# Patient Record
Sex: Female | Born: 1985
Health system: Southern US, Community
[De-identification: ages and names within clinical notes are randomized; demographics above are authoritative.]

## PROBLEM LIST (undated history)

## (undated) DIAGNOSIS — Z789 Other specified health status: Secondary | ICD-10-CM

## (undated) DIAGNOSIS — D649 Anemia, unspecified: Secondary | ICD-10-CM

## (undated) DIAGNOSIS — T8859XA Other complications of anesthesia, initial encounter: Secondary | ICD-10-CM

## (undated) DIAGNOSIS — K0889 Other specified disorders of teeth and supporting structures: Secondary | ICD-10-CM

## (undated) DIAGNOSIS — IMO0002 Reserved for concepts with insufficient information to code with codable children: Secondary | ICD-10-CM

## (undated) DIAGNOSIS — R519 Headache, unspecified: Secondary | ICD-10-CM

## (undated) DIAGNOSIS — I10 Essential (primary) hypertension: Secondary | ICD-10-CM

## (undated) DIAGNOSIS — Z8619 Personal history of other infectious and parasitic diseases: Secondary | ICD-10-CM

## (undated) DIAGNOSIS — R1310 Dysphagia, unspecified: Secondary | ICD-10-CM

## (undated) DIAGNOSIS — R0602 Shortness of breath: Secondary | ICD-10-CM

## (undated) DIAGNOSIS — M549 Dorsalgia, unspecified: Secondary | ICD-10-CM

## (undated) DIAGNOSIS — R51 Headache: Secondary | ICD-10-CM

## (undated) DIAGNOSIS — R002 Palpitations: Secondary | ICD-10-CM

## (undated) DIAGNOSIS — M7989 Other specified soft tissue disorders: Secondary | ICD-10-CM

## (undated) DIAGNOSIS — T4145XA Adverse effect of unspecified anesthetic, initial encounter: Secondary | ICD-10-CM

## (undated) HISTORY — DX: Anemia, unspecified: D64.9

## (undated) HISTORY — DX: Reserved for concepts with insufficient information to code with codable children: IMO0002

## (undated) HISTORY — DX: Palpitations: R00.2

## (undated) HISTORY — DX: Essential (primary) hypertension: I10

## (undated) HISTORY — DX: Headache, unspecified: R51.9

## (undated) HISTORY — DX: Headache: R51

## (undated) HISTORY — PX: OTHER SURGICAL HISTORY: SHX169

## (undated) HISTORY — DX: Shortness of breath: R06.02

## (undated) HISTORY — DX: Dysphagia, unspecified: R13.10

## (undated) HISTORY — DX: Other specified soft tissue disorders: M79.89

## (undated) HISTORY — PX: EYE SURGERY: SHX253

## (undated) HISTORY — PX: VAGINAL URETHRAL SEPTUM EXCISION: SHX2645

## (undated) HISTORY — DX: Personal history of other infectious and parasitic diseases: Z86.19

## (undated) HISTORY — DX: Dorsalgia, unspecified: M54.9

## (undated) HISTORY — DX: Other specified disorders of teeth and supporting structures: K08.89

---

## 2008-05-08 ENCOUNTER — Other Ambulatory Visit: Admission: RE | Admit: 2008-05-08 | Discharge: 2008-05-08 | Payer: Self-pay | Admitting: Obstetrics and Gynecology

## 2008-10-01 ENCOUNTER — Emergency Department (HOSPITAL_COMMUNITY): Admission: EM | Admit: 2008-10-01 | Discharge: 2008-10-01 | Payer: Self-pay | Admitting: Emergency Medicine

## 2010-04-16 ENCOUNTER — Other Ambulatory Visit: Admission: RE | Admit: 2010-04-16 | Discharge: 2010-04-16 | Payer: Self-pay | Admitting: Obstetrics and Gynecology

## 2010-12-28 ENCOUNTER — Inpatient Hospital Stay (HOSPITAL_COMMUNITY)
Admission: AD | Admit: 2010-12-28 | Discharge: 2010-12-28 | Payer: Self-pay | Source: Home / Self Care | Attending: Obstetrics and Gynecology | Admitting: Obstetrics and Gynecology

## 2010-12-31 ENCOUNTER — Ambulatory Visit (HOSPITAL_COMMUNITY): Admission: RE | Admit: 2010-12-31 | Payer: Self-pay | Source: Home / Self Care | Admitting: Obstetrics and Gynecology

## 2011-01-05 LAB — CBC
HCT: 39.2 % (ref 36.0–46.0)
Hemoglobin: 13.3 g/dL (ref 12.0–15.0)
MCH: 29.6 pg (ref 26.0–34.0)
MCHC: 33.9 g/dL (ref 30.0–36.0)
MCV: 87.3 fL (ref 78.0–100.0)
Platelets: 278 10*3/uL (ref 150–400)
RBC: 4.49 MIL/uL (ref 3.87–5.11)
RDW: 13.1 % (ref 11.5–15.5)
WBC: 7.6 10*3/uL (ref 4.0–10.5)

## 2011-01-05 LAB — ABO/RH: ABO/RH(D): A POS

## 2011-09-01 ENCOUNTER — Other Ambulatory Visit: Payer: Self-pay | Admitting: Obstetrics and Gynecology

## 2011-09-22 LAB — POCT URINALYSIS DIP (DEVICE)
Bilirubin Urine: NEGATIVE
Glucose, UA: NEGATIVE
Ketones, ur: NEGATIVE
Nitrite: NEGATIVE
Operator id: 235561
Protein, ur: >=300 — AB
Specific Gravity, Urine: >=1.03
Urobilinogen, UA: 0.2
pH: 7.5

## 2011-09-22 LAB — URINE CULTURE: Colony Count: >=100000

## 2011-09-22 LAB — POCT PREGNANCY, URINE: Preg Test, Ur: NEGATIVE

## 2011-12-22 NOTE — L&D Delivery Note (Signed)
Delivery Note At 8:50 AM a viable and healthy female was delivered via Vaginal, Spontaneous Delivery (Presentation: ;  ).  APGAR: 7, 9; weight 7 lb 9.4 oz (3442 g).   Placenta status: Intact, Spontaneous.  Cord: 3 vessels. Loose nuchal cord.  Anesthesia: Epidural  Episiotomy: Median Lacerations: None Suture Repair: 3.0 chromic Est. Blood Loss (mL): 400  Mom to postpartum.  Baby to nursery-stable.  Malek Skog D 03/29/2012, 9:35 AM

## 2012-03-28 ENCOUNTER — Inpatient Hospital Stay (HOSPITAL_COMMUNITY)
Admission: AD | Admit: 2012-03-28 | Discharge: 2012-03-28 | Disposition: A | Discharge status: Home / Self Care | Payer: 59 | Source: Ambulatory Visit | Attending: Obstetrics and Gynecology | Admitting: Obstetrics and Gynecology

## 2012-03-28 ENCOUNTER — Encounter (HOSPITAL_COMMUNITY): Payer: Self-pay | Admitting: *Deleted

## 2012-03-28 DIAGNOSIS — O479 False labor, unspecified: Secondary | ICD-10-CM | POA: Insufficient documentation

## 2012-03-28 HISTORY — DX: Adverse effect of unspecified anesthetic, initial encounter: T41.45XA

## 2012-03-28 HISTORY — DX: Other complications of anesthesia, initial encounter: T88.59XA

## 2012-03-28 NOTE — MAU Note (Signed)
Contractions since 4pm every 2 minutes. No vaginal bleeding and good fetal movement

## 2012-03-28 NOTE — Discharge Instructions (Signed)
Normal Labor and Delivery Your caregiver must first be sure you are in labor. Signs of labor include:  You may pass what is called "the mucus plug" before labor begins. This is a small amount of blood stained mucus.   Regular uterine contractions.   The time between contractions get closer together.   The discomfort and pain gradually gets more intense.   Pains are mostly located in the back.   Pains get worse when walking.   The cervix (the opening of the uterus becomes thinner (begins to efface) and opens up (dilates).  Once you are in labor and admitted into the hospital or care center, your caregiver will do the following:  A complete physical examination.   Check your vital signs (blood pressure, pulse, temperature and the fetal heart rate).   Do a vaginal examination (using a sterile glove and lubricant) to determine:   The position (presentation) of the baby (head [vertex] or buttock first).   The level (station) of the baby's head in the birth canal.   The effacement and dilatation of the cervix.   You may have your pubic hair shaved and be given an enema depending on your caregiver and the circumstance.   An electronic monitor is usually placed on your abdomen. The monitor follows the length and intensity of the contractions, as well as the baby's heart rate.   Usually, your caregiver will insert an IV in your arm with a bottle of sugar water. This is done as a precaution so that medications can be given to you quickly during labor or delivery.  NORMAL LABOR AND DELIVERY IS DIVIDED UP INTO 3 STAGES: First Stage This is when regular contractions begin and the cervix begins to efface and dilate. This stage can last from 3 to 15 hours. The end of the first stage is when the cervix is 100% effaced and 10 centimeters dilated. Pain medications may be given by   Injection (morphine, demerol, etc.)   Regional anesthesia (spinal, caudal or epidural, anesthetics given in  different locations of the spine). Paracervical pain medication may be given, which is an injection of and anesthetic on each side of the cervix.  A pregnant woman may request to have "Natural Childbirth" which is not to have any medications or anesthesia during her labor and delivery. Second Stage This is when the baby comes down through the birth canal (vagina) and is born. This can take 1 to 4 hours. As the baby's head comes down through the birth canal, you may feel like you are going to have a bowel movement. You will get the urge to bear down and push until the baby is delivered. As the baby's head is being delivered, the caregiver will decide if an episiotomy (a cut in the perineum and vagina area) is needed to prevent tearing of the tissue in this area. The episiotomy is sewn up after the delivery of the baby and placenta. Sometimes a mask with nitrous oxide is given for the mother to breath during the delivery of the baby to help if there is too much pain. The end of Stage 2 is when the baby is fully delivered. Then when the umbilical cord stops pulsating it is clamped and cut. Third Stage The third stage begins after the baby is completely delivered and ends after the placenta (afterbirth) is delivered. This usually takes 5 to 30 minutes. After the placenta is delivered, a medication is given either by intravenous or injection to help contract   the uterus and prevent bleeding. The third stage is not painful and pain medication is usually not necessary. If an episiotomy was done, it is repaired at this time. After the delivery, the mother is watched and monitored closely for 1 to 2 hours to make sure there is no postpartum bleeding (hemorrhage). If there is a lot of bleeding, medication is given to contract the uterus and stop the bleeding. Document Released: 09/15/2008 Document Revised: 11/26/2011 Document Reviewed: 09/15/2008 Exit[employer] Patient Information 2012 [employer]. 

## 2012-03-29 ENCOUNTER — Encounter (HOSPITAL_COMMUNITY): Payer: Self-pay | Admitting: *Deleted

## 2012-03-29 ENCOUNTER — Encounter (HOSPITAL_COMMUNITY): Payer: Self-pay | Admitting: Anesthesiology

## 2012-03-29 ENCOUNTER — Inpatient Hospital Stay (HOSPITAL_COMMUNITY)
Admission: AD | Admit: 2012-03-29 | Discharge: 2012-03-31 | DRG: 775 | Disposition: A | Discharge status: Home / Self Care | Payer: 59 | Attending: Obstetrics and Gynecology | Admitting: Obstetrics and Gynecology

## 2012-03-29 ENCOUNTER — Inpatient Hospital Stay (HOSPITAL_COMMUNITY): Payer: 59 | Admitting: Anesthesiology

## 2012-03-29 DIAGNOSIS — Z3480 Encounter for supervision of other normal pregnancy, unspecified trimester: Secondary | ICD-10-CM

## 2012-03-29 HISTORY — DX: Other specified health status: Z78.9

## 2012-03-29 LAB — CBC
Hemoglobin: 13.8 g/dL (ref 12.0–15.0)
MCV: 90 fL (ref 78.0–100.0)
Platelets: 230 10*3/uL (ref 150–400)
RBC: 4.51 MIL/uL (ref 3.87–5.11)
WBC: 14.9 10*3/uL — ABNORMAL HIGH (ref 4.0–10.5)

## 2012-03-29 LAB — RPR: RPR Ser Ql: NONREACTIVE

## 2012-03-29 MED ORDER — ONDANSETRON HCL 4 MG/2ML IJ SOLN: 4.0000 mg | Freq: Four times a day (QID) | INTRAMUSCULAR | Status: DC | PRN

## 2012-03-29 MED ORDER — LACTATED RINGERS IV SOLN
INTRAVENOUS | Status: DC
  Administered 2012-03-29 (×2): via INTRAVENOUS

## 2012-03-29 MED ORDER — LANOLIN HYDROUS EX OINT: TOPICAL_OINTMENT | CUTANEOUS | Status: DC | PRN

## 2012-03-29 MED ORDER — BENZOCAINE-MENTHOL 20-0.5 % EX AERO
1.0000 "application " | INHALATION_SPRAY | CUTANEOUS | Status: DC | PRN
  Administered 2012-03-29: 1 via TOPICAL

## 2012-03-29 MED ORDER — OXYTOCIN BOLUS FROM INFUSION
500.0000 mL | Freq: Once | INTRAVENOUS | Status: DC
  Filled 2012-03-29: qty 1000
  Filled 2012-03-29: qty 500

## 2012-03-29 MED ORDER — TETANUS-DIPHTH-ACELL PERTUSSIS 5-2.5-18.5 LF-MCG/0.5 IM SUSP: 0.5000 mL | Freq: Once | INTRAMUSCULAR | Status: DC

## 2012-03-29 MED ORDER — FENTANYL 2.5 MCG/ML BUPIVACAINE 1/10 % EPIDURAL INFUSION (WH - ANES)
14.0000 mL/h | INTRAMUSCULAR | Status: DC
  Administered 2012-03-29: 14 mL/h via EPIDURAL
  Filled 2012-03-29: qty 60

## 2012-03-29 MED ORDER — OXYCODONE-ACETAMINOPHEN 5-325 MG PO TABS: 1.0000 | ORAL_TABLET | ORAL | Status: DC | PRN

## 2012-03-29 MED ORDER — LACTATED RINGERS IV SOLN: 500.0000 mL | INTRAVENOUS | Status: DC | PRN

## 2012-03-29 MED ORDER — SIMETHICONE 80 MG PO CHEW: 80.0000 mg | CHEWABLE_TABLET | ORAL | Status: DC | PRN

## 2012-03-29 MED ORDER — BENZOCAINE-MENTHOL 20-0.5 % EX AERO
INHALATION_SPRAY | CUTANEOUS | Status: AC
  Administered 2012-03-29: 1 via TOPICAL
  Filled 2012-03-29: qty 56

## 2012-03-29 MED ORDER — PHENYLEPHRINE 40 MCG/ML (10ML) SYRINGE FOR IV PUSH (FOR BLOOD PRESSURE SUPPORT): 80.0000 ug | PREFILLED_SYRINGE | INTRAVENOUS | Status: DC | PRN

## 2012-03-29 MED ORDER — DIPHENHYDRAMINE HCL 50 MG/ML IJ SOLN: 12.5000 mg | INTRAMUSCULAR | Status: DC | PRN

## 2012-03-29 MED ORDER — LIDOCAINE HCL (PF) 1 % IJ SOLN
INTRAMUSCULAR | Status: DC | PRN
  Administered 2012-03-29: 4 mL
  Administered 2012-03-29: 30 mL
  Administered 2012-03-29 (×2): 4 mL

## 2012-03-29 MED ORDER — LACTATED RINGERS IV SOLN: 500.0000 mL | Freq: Once | INTRAVENOUS | Status: DC

## 2012-03-29 MED ORDER — LIDOCAINE HCL (PF) 1 % IJ SOLN
30.0000 mL | INTRAMUSCULAR | Status: DC | PRN
  Filled 2012-03-29: qty 30

## 2012-03-29 MED ORDER — ONDANSETRON HCL 4 MG PO TABS: 4.0000 mg | ORAL_TABLET | ORAL | Status: DC | PRN

## 2012-03-29 MED ORDER — EPHEDRINE 5 MG/ML INJ
10.0000 mg | INTRAVENOUS | Status: DC | PRN
  Filled 2012-03-29: qty 4

## 2012-03-29 MED ORDER — FLEET ENEMA 7-19 GM/118ML RE ENEM: 1.0000 | ENEMA | RECTAL | Status: DC | PRN

## 2012-03-29 MED ORDER — WITCH HAZEL-GLYCERIN EX PADS: 1.0000 "application " | MEDICATED_PAD | CUTANEOUS | Status: DC | PRN

## 2012-03-29 MED ORDER — ONDANSETRON HCL 4 MG/2ML IJ SOLN: 4.0000 mg | INTRAMUSCULAR | Status: DC | PRN

## 2012-03-29 MED ORDER — CITRIC ACID-SODIUM CITRATE 334-500 MG/5ML PO SOLN
30.0000 mL | ORAL | Status: DC | PRN
  Filled 2012-03-29: qty 15

## 2012-03-29 MED ORDER — OXYTOCIN 20 UNITS IN LACTATED RINGERS INFUSION - SIMPLE
125.0000 mL/h | Freq: Once | INTRAVENOUS | Status: AC
  Administered 2012-03-29: 999 mL/h via INTRAVENOUS

## 2012-03-29 MED ORDER — SENNOSIDES-DOCUSATE SODIUM 8.6-50 MG PO TABS
2.0000 | ORAL_TABLET | Freq: Every day | ORAL | Status: DC
  Administered 2012-03-29: 2 via ORAL

## 2012-03-29 MED ORDER — EPHEDRINE 5 MG/ML INJ: 10.0000 mg | INTRAVENOUS | Status: DC | PRN

## 2012-03-29 MED ORDER — PRENATAL MULTIVITAMIN CH
1.0000 | ORAL_TABLET | Freq: Every day | ORAL | Status: DC
  Administered 2012-03-29 – 2012-03-31 (×3): 1 via ORAL
  Filled 2012-03-29 (×3): qty 1

## 2012-03-29 MED ORDER — TERBUTALINE SULFATE 1 MG/ML IJ SOLN
INTRAMUSCULAR | Status: AC
  Administered 2012-03-29: 0.25 mg
  Filled 2012-03-29: qty 1

## 2012-03-29 MED ORDER — ACETAMINOPHEN 325 MG PO TABS: 650.0000 mg | ORAL_TABLET | ORAL | Status: DC | PRN

## 2012-03-29 MED ORDER — ZOLPIDEM TARTRATE 5 MG PO TABS: 5.0000 mg | ORAL_TABLET | Freq: Every evening | ORAL | Status: DC | PRN

## 2012-03-29 MED ORDER — DIBUCAINE 1 % RE OINT: 1.0000 "application " | TOPICAL_OINTMENT | RECTAL | Status: DC | PRN

## 2012-03-29 MED ORDER — DIPHENHYDRAMINE HCL 25 MG PO CAPS: 25.0000 mg | ORAL_CAPSULE | Freq: Four times a day (QID) | ORAL | Status: DC | PRN

## 2012-03-29 MED ORDER — PHENYLEPHRINE 40 MCG/ML (10ML) SYRINGE FOR IV PUSH (FOR BLOOD PRESSURE SUPPORT)
80.0000 ug | PREFILLED_SYRINGE | INTRAVENOUS | Status: DC | PRN
  Filled 2012-03-29: qty 5

## 2012-03-29 MED ORDER — IBUPROFEN 600 MG PO TABS: 600.0000 mg | ORAL_TABLET | Freq: Four times a day (QID) | ORAL | Status: DC | PRN

## 2012-03-29 MED ORDER — IBUPROFEN 600 MG PO TABS
600.0000 mg | ORAL_TABLET | Freq: Four times a day (QID) | ORAL | Status: DC
  Administered 2012-03-29 – 2012-03-31 (×8): 600 mg via ORAL
  Filled 2012-03-29 (×8): qty 1

## 2012-03-29 NOTE — Anesthesia Procedure Notes (Signed)
Epidural Patient location during procedure: OB Start time: 03/29/2012 7:21 AM Reason for block: procedure for pain  Staffing Anesthesiologist: Raegan Winders Performed by: anesthesiologist   Preanesthetic Checklist Completed: patient identified, site marked, surgical consent, pre-op evaluation, timeout performed, IV checked, risks and benefits discussed and monitors and equipment checked  Epidural Patient position: sitting Prep: site prepped and draped and DuraPrep Patient monitoring: continuous pulse ox and blood pressure Approach: midline Injection technique: LOR air  Needle:  Needle type: Tuohy  Needle gauge: 17 G Needle length: 9 cm Catheter type: closed end flexible Catheter size: 19 Gauge Test dose: negative  Assessment Events: blood not aspirated, injection not painful, no injection resistance, negative IV test and no paresthesia  Additional Notes Discussed risk of headache, infection, bleeding, nerve injury and failed or incomplete block.  Patient voices understanding and wishes to proceed.

## 2012-03-29 NOTE — Anesthesia Preprocedure Evaluation (Signed)
Anesthesia Evaluation  Patient identified by MRN, date of birth, ID band Patient awake    Reviewed: Allergy & Precautions, H&P , NPO status , Patient's Chart, lab work & pertinent test results, reviewed documented beta blocker date and time   Airway Mallampati: III TM Distance: >3 FB Neck ROM: full    Dental  (+) Teeth Intact   Pulmonary neg pulmonary ROS,  breath sounds clear to auscultation        Cardiovascular negative cardio ROS  Rhythm:regular Rate:Normal     Neuro/Psych negative neurological ROS  negative psych ROS   GI/Hepatic negative GI ROS, Neg liver ROS,   Endo/Other  Morbid obesity  Renal/GU negative Renal ROS     Musculoskeletal   Abdominal   Peds  Hematology negative hematology ROS (+)   Anesthesia Other Findings   Reproductive/Obstetrics (+) Pregnancy                           Anesthesia Physical Anesthesia Plan  ASA: II  Anesthesia Plan: Epidural   Post-op Pain Management:    Induction:   Airway Management Planned:   Additional Equipment:   Intra-op Plan:   Post-operative Plan:   Informed Consent: I have reviewed the patients History and Physical, chart, labs and discussed the procedure including the risks, benefits and alternatives for the proposed anesthesia with the patient or authorized representative who has indicated his/her understanding and acceptance.     Plan Discussed with:   Anesthesia Plan Comments:         Anesthesia Quick Evaluation

## 2012-03-29 NOTE — Anesthesia Postprocedure Evaluation (Signed)
  Anesthesia Post Note  Patient: Glenda Crosby  Procedure(s) Performed: * No procedures listed *  Anesthesia type: Epidural  Patient location: Mother/Baby  Post pain: Pain level controlled  Post assessment: Post-op Vital signs reviewed  Last Vitals:  Filed Vitals:   03/29/12 1630  BP: 133/73  Pulse: 89  Temp: 37.1 C  Resp: 20    Post vital signs: Reviewed  Level of consciousness:alert  Complications: No apparent anesthesia complications

## 2012-03-29 NOTE — MAU Note (Signed)
Pt presents with contractions and pressure, has not felt baby move x 2 hours

## 2012-03-30 LAB — CBC
HCT: 34.8 % — ABNORMAL LOW (ref 36.0–46.0)
Hemoglobin: 11.3 g/dL — ABNORMAL LOW (ref 12.0–15.0)
WBC: 12.2 10*3/uL — ABNORMAL HIGH (ref 4.0–10.5)

## 2012-03-30 NOTE — Progress Notes (Signed)
PPD#1 Pt has no complaints. Lochia - wnl VSSAF Imp/ doing well Plan/ routine care.

## 2012-03-31 NOTE — Discharge Summary (Signed)
Discharge diagnoses-  #1-39 week and 6 day intrauterine pregnancy delivered 7 pound 9.4 ounce female infant Apgars 7 and 9  #2-blood type A-positive  Procedures-normal spontaneous delivery-4/9-Dr. Arlyce Dice  Summary-this 26 year old gravida 2 now para 1 was admitted in labor at term after an uneventful pregnancy and had a normal spontaneous delivery of a 7 pound 9.4 ounce female infant with Apgars of 7 and 9. Her postpartum course was benign. On the morning of 4/11 she was ambulating well tolerating a regular diet well breast-feeding without difficulty and was desirous of discharge. Accordingly she was given all appropriate instructions per discharge brochure and understood all instructions well. Discharge medications include vitamins-1 and as long as she is breast-feeding and she will use Tylenol and or Advil as needed for discomfort.   A CBC on 4/10 was 11.3/12.2 platelet count 191,000.  Follow up in the office will be in 4 weeks time or as needed.

## 2012-04-17 NOTE — H&P (Addendum)
This patient was admitted prior to my taking over her care on 03/29/2012.  This note is being filed out of sequence.  Date of admission:  03/29/2012:  26 y.o. G2P0010  Estimated Date of Delivery: 03/30/12 admitted at 39/[redacted] weeks gestation in labor. Prenatal course was uncomplicated. Prenatal labs: Blood Type:A+.  Screening tests for HIV, Syphilis, Hepatitis B, Rubella sensitivity, fetal anomalies, gestational diabetes, and perineal group B strep colonization were negative.    Afebrile, VSS Heart and Lungs: No active disease Abdomen: soft, gravid, EFW AGA. Cervical exam:  Dilated.  Impression: Labor  Plan:  TOL

## 2012-11-25 IMAGING — US US OB COMP LESS 14 WK
1 series · 14 of 28 positions shown · non-contrast
Comparison: None

CLINICAL DATA: Bleeding; ;

OBSTETRIC <14 WK US AND TRANSVAGINAL OB US
TECHNIQUE: Both transabdominal and transvaginal ultrasound
examinations were performed for complete evaluation of the
gestation as well as the maternal uterus, adnexal regions, and
pelvic cul-de-sac.

[Series 1: us ob comp less 14 wks · 14 of 36 slices shown]
[im 2/36]
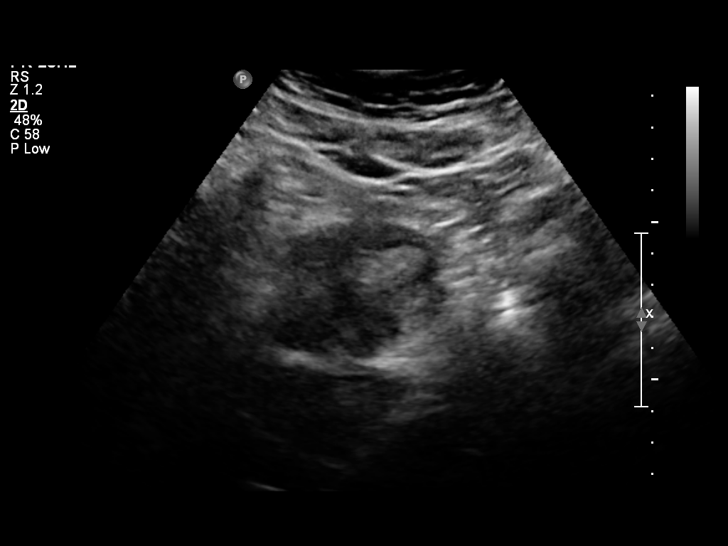
[im 4/36]
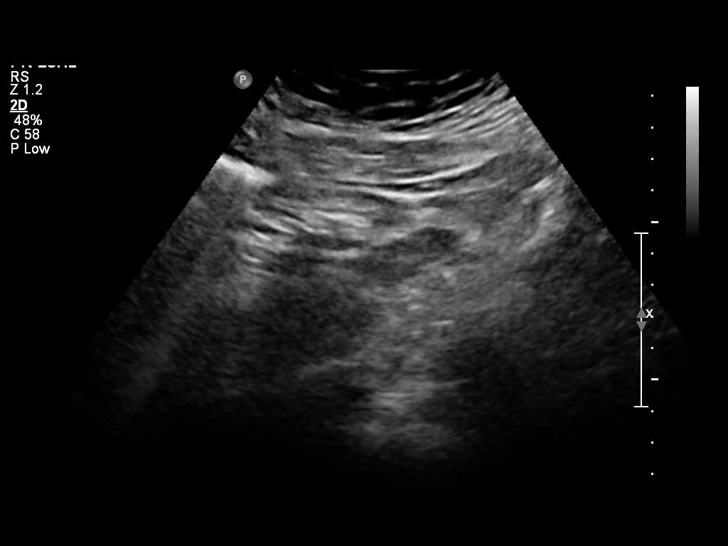
[im 7/36]
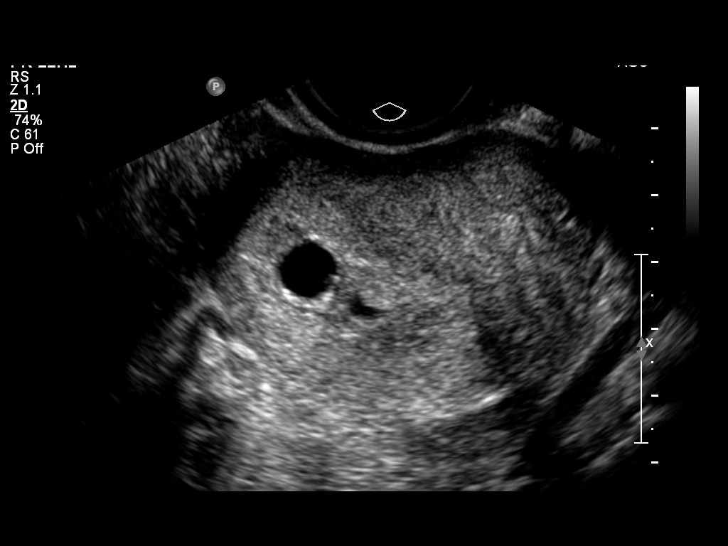
[im 10/36]
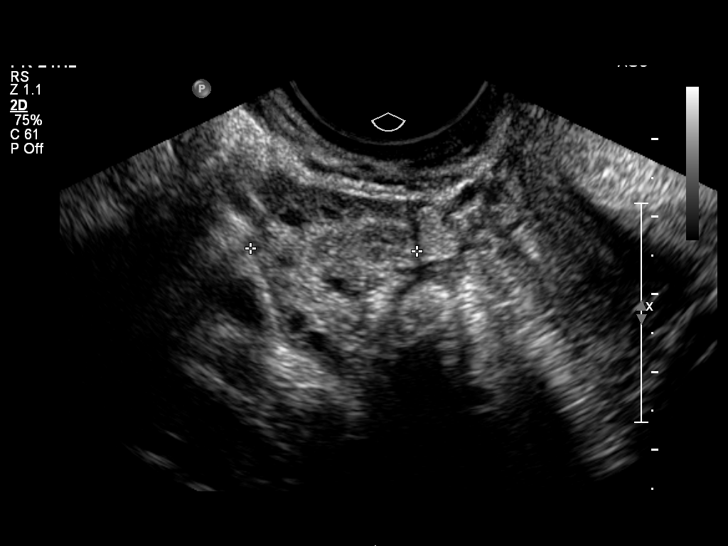
[im 12/36]
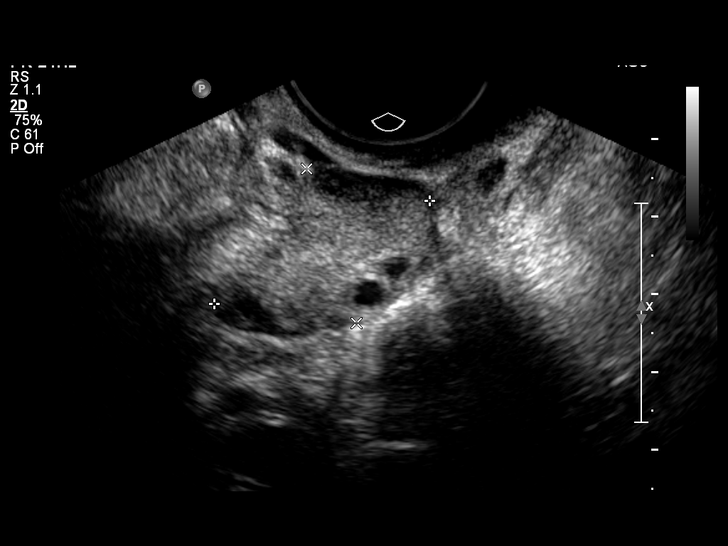
[im 15/36]
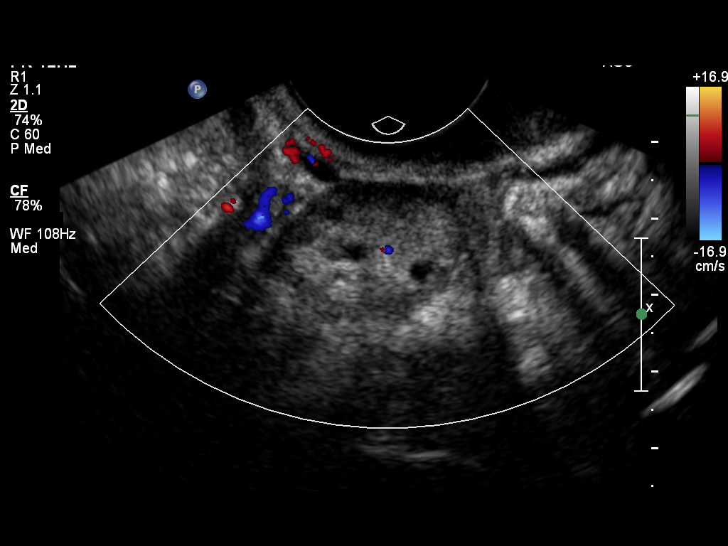
[im 17/36]
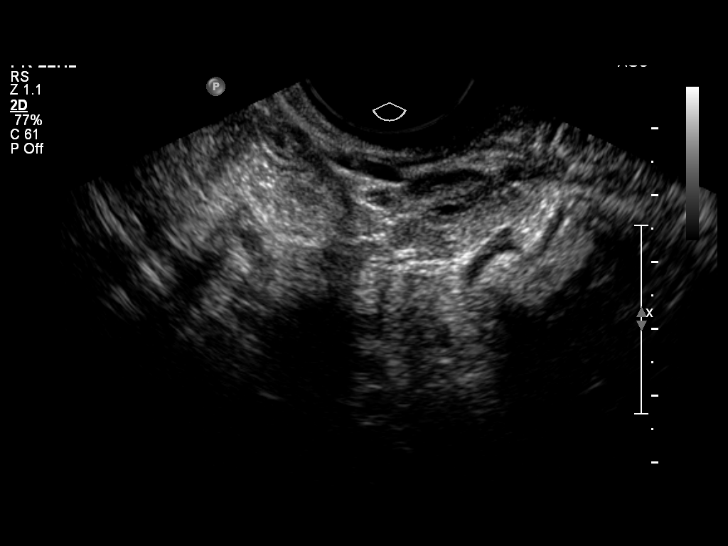
[im 20/36]
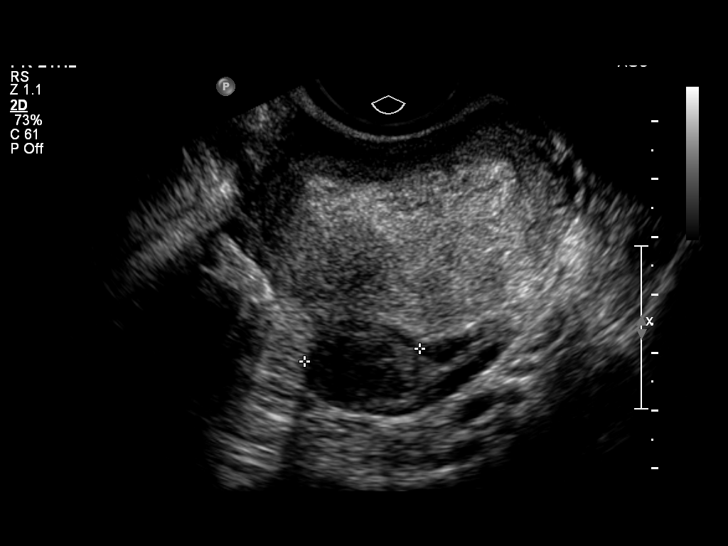
[im 23/36]
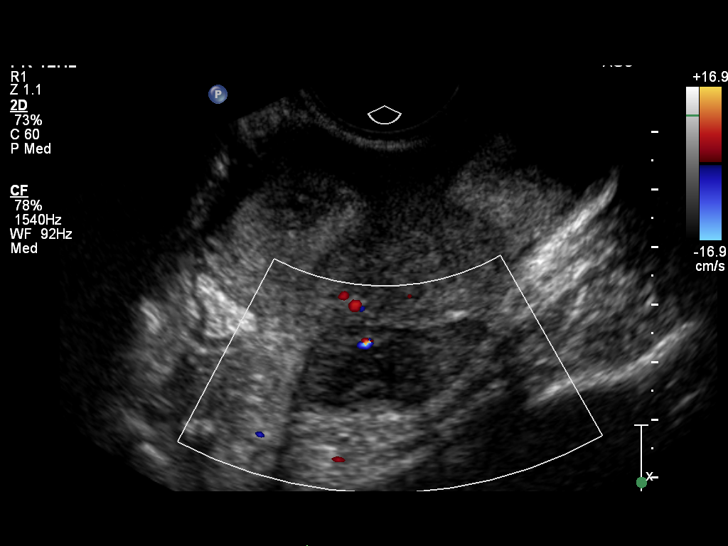
[im 25/36]
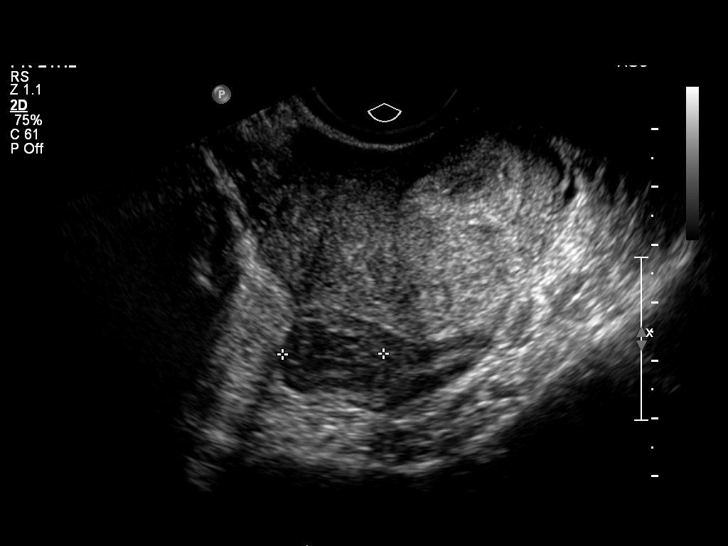
[im 28/36]
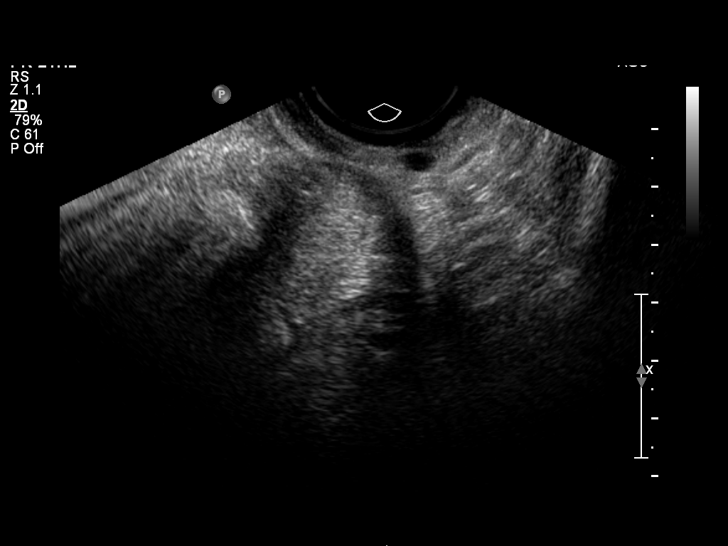
[im 30/36]
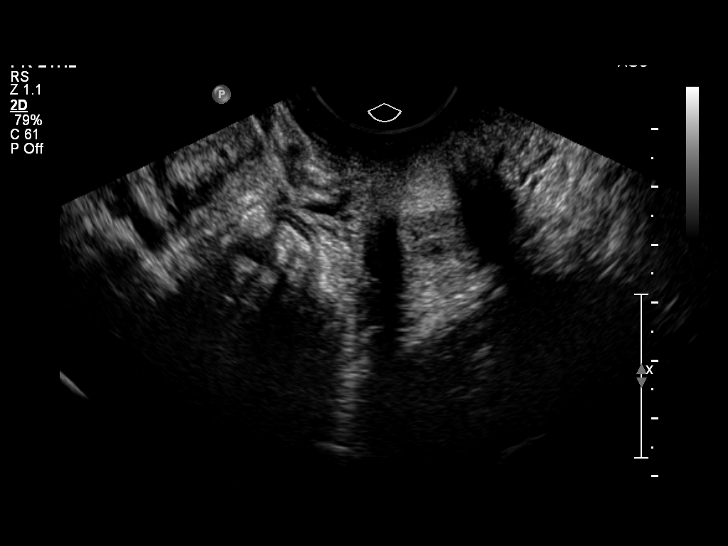
[im 33/36]
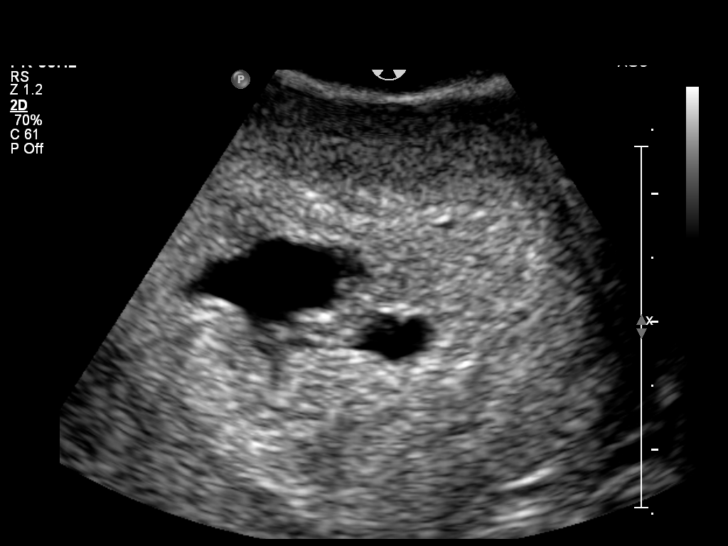
[im 36/36]
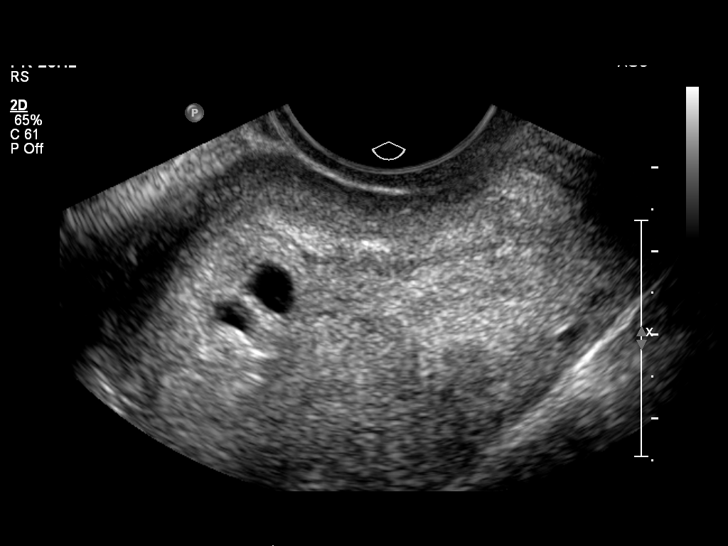

[14 of 28 positions shown; findings below may reference images not displayed]

FINDINGS: There is an irregularly shaped gestational sac within the
uterus.  Based on mean sac diameter of 10.8 cm, estimated
gestational age is 6 weeks 1 day.  No yolk sac or fetal pole
currently visualized.  There is a small subchorionic hemorrhage.

Left corpus luteal cyst present.  Right ovary unremarkable.  No
adnexal masses.  No free fluid.
IMPRESSION: Irregularly shaped intrauterine gestational sac, 6 weeks 1 day by
mean sac diameter.  No yolk sac or fetal pole.  Recommend continued
close follow-up with serial quantitative beta HCGs and ultrasounds.

## 2012-12-21 NOTE — L&D Delivery Note (Signed)
Patient was C/C/+2 and pushed for 6 minutes with epidural.   NSVD  Female infant, Apgars 7/9, weight pending.   FHT not tracing well, therefore R mediolateral episiotomy performed to expedite delivery, this was repaired with 2-0 and 3-0 vicryl. Fundus was firm, pt received IM pitocin after delivery of placenta. EBL was expected. Placenta was delivered intact. Vagina was clear.  Baby was vigorous to bedside.  Philip Aspen

## 2013-03-10 LAB — OB RESULTS CONSOLE ABO/RH: RH Type: POSITIVE

## 2013-03-10 LAB — OB RESULTS CONSOLE HIV ANTIBODY (ROUTINE TESTING): HIV: NONREACTIVE

## 2013-03-10 LAB — OB RESULTS CONSOLE RUBELLA ANTIBODY, IGM: Rubella: IMMUNE

## 2013-08-18 ENCOUNTER — Encounter (HOSPITAL_COMMUNITY): Payer: Self-pay | Admitting: *Deleted

## 2013-08-18 ENCOUNTER — Telehealth (HOSPITAL_COMMUNITY): Payer: Self-pay | Admitting: *Deleted

## 2013-08-18 NOTE — Telephone Encounter (Signed)
Preadmission screen  

## 2013-08-20 ENCOUNTER — Inpatient Hospital Stay (HOSPITAL_COMMUNITY)
Admission: AD | Admit: 2013-08-20 | Discharge: 2013-08-22 | DRG: 775 | Disposition: A | Discharge status: Home / Self Care | Payer: 59 | Source: Ambulatory Visit | Attending: Obstetrics and Gynecology | Admitting: Obstetrics and Gynecology

## 2013-08-20 ENCOUNTER — Encounter (HOSPITAL_COMMUNITY): Payer: Self-pay | Admitting: *Deleted

## 2013-08-20 LAB — CBC
HCT: 35.8 % — ABNORMAL LOW (ref 36.0–46.0)
MCH: 29.4 pg (ref 26.0–34.0)
MCV: 85.4 fL (ref 78.0–100.0)
Platelets: 222 10*3/uL (ref 150–400)
RDW: 14.4 % (ref 11.5–15.5)
WBC: 10.3 10*3/uL (ref 4.0–10.5)

## 2013-08-20 LAB — TYPE AND SCREEN: Antibody Screen: NEGATIVE

## 2013-08-20 MED ORDER — TETANUS-DIPHTH-ACELL PERTUSSIS 5-2.5-18.5 LF-MCG/0.5 IM SUSP
0.5000 mL | Freq: Once | INTRAMUSCULAR | Status: AC
Start: 2013-08-21 — End: 2013-08-21
  Administered 2013-08-21: 0.5 mL via INTRAMUSCULAR
  Filled 2013-08-20: qty 0.5

## 2013-08-20 MED ORDER — CITRIC ACID-SODIUM CITRATE 334-500 MG/5ML PO SOLN: 30.0000 mL | ORAL | Status: DC | PRN

## 2013-08-20 MED ORDER — ONDANSETRON HCL 4 MG/2ML IJ SOLN: 4.0000 mg | INTRAMUSCULAR | Status: DC | PRN

## 2013-08-20 MED ORDER — ACETAMINOPHEN 325 MG PO TABS: 650.0000 mg | ORAL_TABLET | ORAL | Status: DC | PRN

## 2013-08-20 MED ORDER — LANOLIN HYDROUS EX OINT: TOPICAL_OINTMENT | CUTANEOUS | Status: DC | PRN

## 2013-08-20 MED ORDER — IBUPROFEN 600 MG PO TABS: 600.0000 mg | ORAL_TABLET | Freq: Four times a day (QID) | ORAL | Status: DC | PRN

## 2013-08-20 MED ORDER — ONDANSETRON HCL 4 MG PO TABS: 4.0000 mg | ORAL_TABLET | ORAL | Status: DC | PRN

## 2013-08-20 MED ORDER — BENZOCAINE-MENTHOL 20-0.5 % EX AERO
1.0000 "application " | INHALATION_SPRAY | CUTANEOUS | Status: DC | PRN
  Administered 2013-08-21: 1 via TOPICAL
  Filled 2013-08-20: qty 56

## 2013-08-20 MED ORDER — DIPHENHYDRAMINE HCL 25 MG PO CAPS: 25.0000 mg | ORAL_CAPSULE | Freq: Four times a day (QID) | ORAL | Status: DC | PRN

## 2013-08-20 MED ORDER — FLEET ENEMA 7-19 GM/118ML RE ENEM: 1.0000 | ENEMA | RECTAL | Status: DC | PRN

## 2013-08-20 MED ORDER — OXYTOCIN 40 UNITS IN LACTATED RINGERS INFUSION - SIMPLE MED: 62.5000 mL/h | INTRAVENOUS | Status: DC

## 2013-08-20 MED ORDER — ONDANSETRON HCL 4 MG/2ML IJ SOLN: 4.0000 mg | Freq: Four times a day (QID) | INTRAMUSCULAR | Status: DC | PRN

## 2013-08-20 MED ORDER — LIDOCAINE HCL (PF) 1 % IJ SOLN
30.0000 mL | INTRAMUSCULAR | Status: DC | PRN
  Filled 2013-08-20: qty 30

## 2013-08-20 MED ORDER — LACTATED RINGERS IV SOLN: 500.0000 mL | INTRAVENOUS | Status: DC | PRN

## 2013-08-20 MED ORDER — OXYTOCIN BOLUS FROM INFUSION: 500.0000 mL | INTRAVENOUS | Status: DC

## 2013-08-20 MED ORDER — OXYTOCIN 10 UNIT/ML IJ SOLN
INTRAMUSCULAR | Status: AC
  Administered 2013-08-20: 10 [IU] via INTRAMUSCULAR
  Filled 2013-08-20: qty 2

## 2013-08-20 MED ORDER — WITCH HAZEL-GLYCERIN EX PADS: 1.0000 "application " | MEDICATED_PAD | CUTANEOUS | Status: DC | PRN

## 2013-08-20 MED ORDER — OXYCODONE-ACETAMINOPHEN 5-325 MG PO TABS: 1.0000 | ORAL_TABLET | ORAL | Status: DC | PRN

## 2013-08-20 MED ORDER — LIDOCAINE HCL (PF) 1 % IJ SOLN
INTRAMUSCULAR | Status: AC
  Filled 2013-08-20: qty 30

## 2013-08-20 MED ORDER — LACTATED RINGERS IV SOLN: INTRAVENOUS | Status: DC

## 2013-08-20 MED ORDER — LIDOCAINE HCL (PF) 1 % IJ SOLN
30.0000 mL | INTRAMUSCULAR | Status: DC | PRN
  Administered 2013-08-20: 30 mL via SUBCUTANEOUS
  Filled 2013-08-20: qty 30

## 2013-08-20 MED ORDER — OXYTOCIN 10 UNIT/ML IJ SOLN
INTRAMUSCULAR | Status: AC
  Filled 2013-08-20: qty 1

## 2013-08-20 MED ORDER — PRENATAL MULTIVITAMIN CH
1.0000 | ORAL_TABLET | Freq: Every day | ORAL | Status: DC
  Administered 2013-08-21: 1 via ORAL
  Filled 2013-08-20: qty 1

## 2013-08-20 MED ORDER — OXYTOCIN BOLUS FROM INFUSION
500.0000 mL | INTRAVENOUS | Status: DC
Start: 2013-08-20 — End: 2013-08-20

## 2013-08-20 MED ORDER — ZOLPIDEM TARTRATE 5 MG PO TABS: 5.0000 mg | ORAL_TABLET | Freq: Every evening | ORAL | Status: DC | PRN

## 2013-08-20 MED ORDER — SENNOSIDES-DOCUSATE SODIUM 8.6-50 MG PO TABS
2.0000 | ORAL_TABLET | Freq: Every day | ORAL | Status: DC
  Administered 2013-08-21 (×2): 2 via ORAL

## 2013-08-20 MED ORDER — SIMETHICONE 80 MG PO CHEW: 80.0000 mg | CHEWABLE_TABLET | ORAL | Status: DC | PRN

## 2013-08-20 MED ORDER — DIBUCAINE 1 % RE OINT: 1.0000 "application " | TOPICAL_OINTMENT | RECTAL | Status: DC | PRN

## 2013-08-20 MED ORDER — IBUPROFEN 600 MG PO TABS
600.0000 mg | ORAL_TABLET | Freq: Four times a day (QID) | ORAL | Status: DC
  Administered 2013-08-21 – 2013-08-22 (×6): 600 mg via ORAL
  Filled 2013-08-20 (×6): qty 1

## 2013-08-20 NOTE — H&P (Signed)
27 y.o. [redacted]w[redacted]d  [id number] comes in c/o ctx.  Otherwise has good fetal movement and no bleeding.  Past Medical History  Diagnosis Date  . Complication of anesthesia     Hypotensive  per patient as being a problem  . No pertinent past medical history     Past Surgical History  Procedure Laterality Date  . Vaginal urethral septum excision    . Wisdom teeth    . Eye surgery      OB History  Gravida Para Term Preterm AB SAB TAB Ectopic Multiple Living  3 2 2  1 1    2     # Outcome Date GA Lbr Len/2nd Weight Sex Delivery Anes PTL Lv  3 TRM 08/20/13 [redacted]w[redacted]d 05:18 / 00:06 4.065 kg (8 lb 15.4 oz) F SVD Local  Y     Comments: none  2 TRM 03/29/12 [redacted]w[redacted]d 14:12 / 02:38 3.442 kg (7 lb 9.4 oz) F SVD EPI  Y  1 SAB               History   Social History  . Marital Status: Married    Spouse Name: N/A    Number of Children: N/A  . Years of Education: N/A   Occupational History  . Not on file.   Social History Main Topics  . Smoking status: Never Smoker   . Smokeless tobacco: Never Used  . Alcohol Use: No  . Drug Use: No  . Sexual Activity: Yes   Other Topics Concern  . Not on file   Social History Narrative  . No narrative on file   Review of patient's allergies indicates no known allergies.    Prenatal Transfer Tool  Maternal Diabetes: No Genetic Screening: Normal Maternal Ultrasounds/Referrals: Normal Fetal Ultrasounds or other Referrals:  None Maternal Substance Abuse:  No Significant Maternal Medications:  none Significant Maternal Lab Results: Lab values include: Group B Strep negative  Other PNC:    Filed Vitals:   08/20/13 2209  BP: 118/65  Pulse: 78  Temp: 98.2 F (36.8 C)  Resp: 20     Lungs/Cor:  NAD Abdomen:  soft, gravid Ex:  no cords, erythema SVE:  8cm, intact  FHTs:  130, good STV, sharp variable decels Toco:  Not traced well   A/P   Admit to L&D  Epidural if desired.  GBS Neg  Elois Averitt

## 2013-08-21 LAB — CBC
Hemoglobin: 11.2 g/dL — ABNORMAL LOW (ref 12.0–15.0)
MCH: 29.6 pg (ref 26.0–34.0)
Platelets: 198 10*3/uL (ref 150–400)
RBC: 3.79 MIL/uL — ABNORMAL LOW (ref 3.87–5.11)
WBC: 10 10*3/uL (ref 4.0–10.5)

## 2013-08-21 MED ORDER — FENTANYL CITRATE 0.05 MG/ML IJ SOLN
INTRAMUSCULAR | Status: AC
  Filled 2013-08-21: qty 2

## 2013-08-21 NOTE — Progress Notes (Signed)
Post Partum Day 1 Subjective: no complaints  Objective: Blood pressure 112/68, pulse 51, temperature 98.7 F (37.1 C), temperature source Oral, resp. rate 18, height 5\' 4"  (1.626 m), weight 99.156 kg (218 lb 9.6 oz), SpO2 100.00%,  breastfeeding.  Physical Exam:  General: alert Lochia: appropriate Uterine Fundus: firm   Recent Labs  08/20/13 2020 08/21/13 0605  HGB 12.3 11.2*  HCT 35.8* 32.7*    Assessment/Plan: Plan for discharge tomorrow   LOS: 1 day   Glenda Crosby D 08/21/2013, 10:22 AM

## 2013-08-21 NOTE — Lactation Note (Signed)
This note was copied from the chart of Glenda Shauna Bodkins. Lactation Consultation Note: Initial visit with mom. She is an experienced BF mom who nursed her last baby for 74 1/2 months- just stopped nursing in March. Mom reports this baby has had a couple of good feedings but has been sleepy today- especially after circ. Reviewed normal newborn behavior the first 24 hours and cluster feeding. No questions at present. To call for assist prn. BF brochure given with resources for support after DC.  Patient Name: Glenda Crosby [id number] Date: 08/21/2013 Reason for consult: Initial assessment   Maternal Data Formula Feeding for Exclusion: No Infant to breast within first hour of birth: Yes Has patient been taught Hand Expression?: Yes Does the patient have breastfeeding experience prior to this delivery?: Yes  Feeding Feeding Type: Breast Milk  LATCH Score/Interventions Latch: Too sleepy or reluctant, no latch achieved, no sucking elicited. (few sucks)  Audible Swallowing: None Intervention(s): Hand expression  Type of Nipple: Everted at rest and after stimulation  Comfort (Breast/Nipple): Soft / non-tender     Hold (Positioning): No assistance needed to correctly position infant at breast. Intervention(s): Breastfeeding basics reviewed  LATCH Score: 6  Lactation Tools Discussed/Used     Consult Status Consult Status: Follow-up Date: 08/22/13 Follow-up type: In-patient    Pamelia Hoit 08/21/2013, 1:41 PM

## 2013-08-22 NOTE — Progress Notes (Signed)
Patient is eating, ambulating, voiding.  Pain control is good.  Filed Vitals:   08/21/13 0600 08/21/13 1100 08/21/13 1742 08/22/13 0535  BP: 112/68 114/61 118/69 119/81  Pulse: 51 65 64 60  Temp: 98.7 F (37.1 C) 98.6 F (37 C) 98.3 F (36.8 C) 98.1 F (36.7 C)  TempSrc:  Oral Oral Oral  Resp: 18 18 18 18   Height:      Weight:      SpO2:        Fundus firm Perineum without swelling.  Lab Results  Component Value Date   WBC 10.0 08/21/2013   HGB 11.2* 08/21/2013   HCT 32.7* 08/21/2013   MCV 86.3 08/21/2013   PLT 198 08/21/2013    --/--/A POS (08/31 2020)/RI  A/P Post partum day 2.  Routine care.  Expect d/c today.    Ciera Beckum A

## 2013-08-22 NOTE — Discharge Summary (Signed)
Obstetric Discharge Summary Reason for Admission: onset of labor Prenatal Procedures: none Intrapartum Procedures: spontaneous vaginal delivery Postpartum Procedures: none Complications-Operative and Postpartum: 2nd degree perineal laceration, medilolateral Hemoglobin  Date Value Range Status  08/21/2013 11.2* 12.0 - 15.0 g/dL Final     HCT  Date Value Range Status  08/21/2013 32.7* 36.0 - 46.0 % Final    Discharge Diagnoses: Term Pregnancy-delivered  Discharge Information: Date: 08/22/2013 Activity: pelvic rest Diet: routine Medications: Ibuprofen Condition: stable Instructions: refer to practice specific booklet Discharge to: home Follow-up Information   Follow up with CALLAHAN, SIDNEY, DO In 4 weeks.   Specialty:  Obstetrics and Gynecology   Contact information:   [address] [place] [address] [phone]       Newborn Data: Live born female  Birth Weight: 8 lb 15.4 oz (4065 g) APGAR: 7, 9  Home with mother.  Oaklen Thiam A 08/22/2013, 7:53 AM

## 2013-08-24 ENCOUNTER — Inpatient Hospital Stay (HOSPITAL_COMMUNITY): Admission: RE | Admit: 2013-08-24 | Payer: 59 | Source: Ambulatory Visit

## 2014-09-17 ENCOUNTER — Other Ambulatory Visit: Payer: Self-pay | Admitting: Obstetrics and Gynecology

## 2014-09-17 LAB — OB RESULTS CONSOLE HEPATITIS B SURFACE ANTIGEN: Hepatitis B Surface Ag: NEGATIVE

## 2014-09-17 LAB — OB RESULTS CONSOLE RUBELLA ANTIBODY, IGM: RUBELLA: IMMUNE

## 2014-09-17 LAB — OB RESULTS CONSOLE RPR: RPR: NONREACTIVE

## 2014-09-17 LAB — OB RESULTS CONSOLE ANTIBODY SCREEN: Antibody Screen: NEGATIVE

## 2014-09-17 LAB — OB RESULTS CONSOLE GC/CHLAMYDIA
Chlamydia: NEGATIVE
GC PROBE AMP, GENITAL: NEGATIVE

## 2014-09-17 LAB — OB RESULTS CONSOLE ABO/RH: RH Type: POSITIVE

## 2014-09-17 LAB — OB RESULTS CONSOLE HIV ANTIBODY (ROUTINE TESTING): HIV: NONREACTIVE

## 2014-09-18 LAB — CYTOLOGY - PAP

## 2014-10-22 ENCOUNTER — Encounter (HOSPITAL_COMMUNITY): Payer: Self-pay | Admitting: *Deleted

## 2014-12-21 NOTE — L&D Delivery Note (Signed)
Delivery Note Patient progressed rapidly to 10/10+2.  She pushed w two contractions, and at 7:54 AM a viable female was delivered via Vaginal, Spontaneous Delivery (Presentation: ; Occiput Anterior).  APGAR: 7, 9; weight 9 lb 5 oz (4224 g).   Placenta status: Intact, Spontaneous.  Cord: 3 vessels with the following complications: None.  Cord pH: n/a  Immediately following delivery, the infant was floppy (likely 2/2 quick labor).  She was transferred to the warmer for warm/dry stimulation.  She began to cry spontaneously and tone quickly improved.  She was transferred to mom's chest for skin to skin.  Anesthesia: None  Episiotomy: None Lacerations: 1st degree Suture Repair: 3.0 vicryl rapide Est. Blood Loss (mL): 300    Mom to postpartum.  Baby to Couplet care / Skin to Skin.  Jaymien Landin GEFFEL Leigha Olberding 04/03/2015, 9:52 AM

## 2015-03-01 ENCOUNTER — Other Ambulatory Visit: Payer: Self-pay

## 2015-03-01 LAB — OB RESULTS CONSOLE GBS: GBS: NEGATIVE

## 2015-03-30 ENCOUNTER — Inpatient Hospital Stay (HOSPITAL_COMMUNITY)
Admission: AD | Admit: 2015-03-30 | Discharge: 2015-03-30 | Disposition: A | Discharge status: Home / Self Care | Payer: 59 | Source: Ambulatory Visit | Attending: Obstetrics and Gynecology | Admitting: Obstetrics and Gynecology

## 2015-03-30 ENCOUNTER — Encounter (HOSPITAL_COMMUNITY): Payer: Self-pay | Admitting: *Deleted

## 2015-03-30 ENCOUNTER — Inpatient Hospital Stay (HOSPITAL_COMMUNITY): Payer: 59

## 2015-03-30 DIAGNOSIS — O368130 Decreased fetal movements, third trimester, not applicable or unspecified: Secondary | ICD-10-CM | POA: Insufficient documentation

## 2015-03-30 DIAGNOSIS — R030 Elevated blood-pressure reading, without diagnosis of hypertension: Secondary | ICD-10-CM | POA: Diagnosis not present

## 2015-03-30 DIAGNOSIS — O133 Gestational [pregnancy-induced] hypertension without significant proteinuria, third trimester: Secondary | ICD-10-CM

## 2015-03-30 DIAGNOSIS — Z3A40 40 weeks gestation of pregnancy: Secondary | ICD-10-CM | POA: Insufficient documentation

## 2015-03-30 DIAGNOSIS — O368190 Decreased fetal movements, unspecified trimester, not applicable or unspecified: Secondary | ICD-10-CM | POA: Insufficient documentation

## 2015-03-30 LAB — COMPREHENSIVE METABOLIC PANEL
ALK PHOS: 97 U/L (ref 39–117)
ALT: 10 U/L (ref 0–35)
AST: 19 U/L (ref 0–37)
Albumin: 3 g/dL — ABNORMAL LOW (ref 3.5–5.2)
Anion gap: 8 (ref 5–15)
BILIRUBIN TOTAL: 0.5 mg/dL (ref 0.3–1.2)
BUN: 7 mg/dL (ref 6–23)
CO2: 21 mmol/L (ref 19–32)
CREATININE: 0.58 mg/dL (ref 0.50–1.10)
Calcium: 8.5 mg/dL (ref 8.4–10.5)
Chloride: 107 mmol/L (ref 96–112)
GLUCOSE: 76 mg/dL (ref 70–99)
Potassium: 3.8 mmol/L (ref 3.5–5.1)
SODIUM: 136 mmol/L (ref 135–145)
Total Protein: 6.1 g/dL (ref 6.0–8.3)

## 2015-03-30 LAB — CBC
HCT: 30.7 % — ABNORMAL LOW (ref 36.0–46.0)
Hemoglobin: 10.2 g/dL — ABNORMAL LOW (ref 12.0–15.0)
MCH: 28.3 pg (ref 26.0–34.0)
MCHC: 33.2 g/dL (ref 30.0–36.0)
MCV: 85 fL (ref 78.0–100.0)
Platelets: 200 10*3/uL (ref 150–400)
RBC: 3.61 MIL/uL — ABNORMAL LOW (ref 3.87–5.11)
RDW: 13.7 % (ref 11.5–15.5)
WBC: 5.9 10*3/uL (ref 4.0–10.5)

## 2015-03-30 LAB — URINALYSIS, DIPSTICK ONLY
Glucose, UA: NEGATIVE mg/dL
Hgb urine dipstick: NEGATIVE
Ketones, ur: >80 mg/dL — AB
Leukocytes, UA: NEGATIVE
Nitrite: NEGATIVE
Protein, ur: NEGATIVE mg/dL
Specific Gravity, Urine: >1.03 — ABNORMAL HIGH (ref 1.005–1.030)
UROBILINOGEN UA: 0.2 mg/dL (ref 0.0–1.0)
pH: 5.5 (ref 5.0–8.0)

## 2015-03-30 LAB — PROTEIN / CREATININE RATIO, URINE
CREATININE, URINE: 279 mg/dL
PROTEIN CREATININE RATIO: 0.06 (ref 0.00–0.15)
Total Protein, Urine: 17 mg/dL

## 2015-03-30 LAB — URIC ACID: URIC ACID, SERUM: 7 mg/dL (ref 2.4–7.0)

## 2015-03-30 LAB — LACTATE DEHYDROGENASE: LDH: 152 U/L (ref 94–250)

## 2015-03-30 NOTE — Discharge Instructions (Signed)

## 2015-03-30 NOTE — MAU Provider Note (Signed)
History     CSN: [id number]  Arrival date and time: 03/30/15 1800   First Provider Initiated Contact with Patient 03/30/15 1918      Chief Complaint  Patient presents with  . Decreased Fetal Movement   HPI   Glenda Crosby is a 29 y.o. female who presents with decreased fetal movement; she has been feeling the baby everyday however movements seem less.  She feels the movements have changed today; she is feeling the baby move only twice an hour and the movements seem not as forceful. Since she has been to MAU she has felt the baby move, but again the movements are less strong.   She denies leaking of fluid or vaginal bleeding.   OB History    Gravida Para Term Preterm AB TAB SAB Ectopic Multiple Living   4 2 2  1  1   2       Past Medical History  Diagnosis Date  . Complication of anesthesia     Hypotensive  per patient as being a problem  . No pertinent past medical history     Past Surgical History  Procedure Laterality Date  . Vaginal urethral septum excision    . Wisdom teeth    . Eye surgery      Family History  Problem Relation Age of Onset  . Cancer Paternal Grandmother     History  Substance Use Topics  . Smoking status: Never Smoker   . Smokeless tobacco: Never Used  . Alcohol Use: No    Allergies: No Known Allergies  Prescriptions prior to admission  Medication Sig Dispense Refill Last Dose  . Prenatal Vit-Fe Fumarate-FA (PRENATAL MULTIVITAMIN) TABS Take 1 tablet by mouth every morning.   08/20/2013 at Unknown   No results found for this or any previous visit (from the past 48 hour(s)).  Review of Systems  Eyes: Negative for blurred vision.  Cardiovascular: Positive for leg swelling.  Gastrointestinal: Negative for abdominal pain.  Neurological: Negative for headaches.   Physical Exam   Blood pressure 138/83, pulse 85, temperature 98.2 F (36.8 C), resp. rate 18, last menstrual period 06/23/2014, unknown if currently  breastfeeding.  Physical Exam  Constitutional: She is oriented to person, place, and time. She appears well-developed and well-nourished. No distress.  HENT:  Head: Normocephalic.  Eyes: Pupils are equal, round, and reactive to light.  Respiratory: Effort normal.  GI: Soft.  Musculoskeletal: Normal range of motion.       Right ankle: She exhibits no swelling.       Left ankle: She exhibits no swelling.  Neurological: She is alert and oriented to person, place, and time. She has normal reflexes.  Negative clonus   Skin: Skin is warm. She is not diaphoretic.  Psychiatric: Her behavior is normal.   Fetal Tracing: Baseline: 115 bpm  Variability:  Moderate  Accelerations: 15x15 Decelerations: None Toco: Irregular pattern, occasional contraction    MAU Course  Procedures  MDM   Discussed patient with Dr. Henderson CloudHorvath.  PIH labs  AFI BPP   Report given to N.Marya LandryFraizer CNM who resumes care of the patient > patient in US at this time.   Assessment and Plan     Duane LopeJennifer I Rasch, NP 03/30/2015 7:30 PM  Results for orders placed or performed during the hospital encounter of 03/30/15 (from the past 24 hour(s))  CBC     Status: Abnormal   Collection Time: 03/30/15  7:35 PM  Result Value Ref Range   WBC  5.9 4.0 - 10.5 K/uL   RBC 3.61 (L) 3.87 - 5.11 MIL/uL   Hemoglobin 10.2 (L) 12.0 - 15.0 g/dL   HCT 16.1 (L) 09.6 - 04.5 %   MCV 85.0 78.0 - 100.0 fL   MCH 28.3 26.0 - 34.0 pg   MCHC 33.2 30.0 - 36.0 g/dL   RDW 40.9 81.1 - 91.4 %   Platelets 200 150 - 400 K/uL  Comprehensive metabolic panel     Status: Abnormal   Collection Time: 03/30/15  7:35 PM  Result Value Ref Range   Sodium 136 135 - 145 mmol/L   Potassium 3.8 3.5 - 5.1 mmol/L   Chloride 107 96 - 112 mmol/L   CO2 21 19 - 32 mmol/L   Glucose, Bld 76 70 - 99 mg/dL   BUN 7 6 - 23 mg/dL   Creatinine, Ser 7.82 0.50 - 1.10 mg/dL   Calcium 8.5 8.4 - 95.6 mg/dL   Total Protein 6.1 6.0 - 8.3 g/dL   Albumin 3.0 (L) 3.5 - 5.2  g/dL   AST 19 0 - 37 U/L   ALT 10 0 - 35 U/L   Alkaline Phosphatase 97 39 - 117 U/L   Total Bilirubin 0.5 0.3 - 1.2 mg/dL   GFR calc non Af Amer >90 >90 mL/min   GFR calc Af Amer >90 >90 mL/min   Anion gap 8 5 - 15  Uric acid     Status: None   Collection Time: 03/30/15  7:35 PM  Result Value Ref Range   Uric Acid, Serum 7.0 2.4 - 7.0 mg/dL  Lactate dehydrogenase     Status: None   Collection Time: 03/30/15  7:35 PM  Result Value Ref Range   LDH 152 94 - 250 U/L  Protein / creatinine ratio, urine     Status: None   Collection Time: 03/30/15  7:42 PM  Result Value Ref Range   Creatinine, Urine 279.00 mg/dL   Total Protein, Urine 17 mg/dL   Protein Creatinine Ratio 0.06 0.00 - 0.15  Urinalysis, dipstick only     Status: Abnormal   Collection Time: 03/30/15  7:42 PM  Result Value Ref Range   Specific Gravity, Urine >1.030 (H) 1.005 - 1.030   pH 5.5 5.0 - 8.0   Glucose, UA NEGATIVE NEGATIVE mg/dL   Hgb urine dipstick NEGATIVE NEGATIVE   Bilirubin Urine SMALL (A) NEGATIVE   Ketones, ur >80 (A) NEGATIVE mg/dL   Protein, ur NEGATIVE NEGATIVE mg/dL   Urobilinogen, UA 0.2 0.0 - 1.0 mg/dL   Nitrite NEGATIVE NEGATIVE   Leukocytes, UA NEGATIVE NEGATIVE   BPP 10/10, AFI 17 per preliminary report Patient Vitals for the past 24 hrs:  BP Temp Pulse Resp  03/30/15 2152 135/78 mmHg - 82 -  03/30/15 2052 137/74 mmHg - 73 -  03/30/15 1946 135/77 mmHg - 85 -  03/30/15 1931 134/74 mmHg - 78 -  03/30/15 1916 135/84 mmHg - 89 -  03/30/15 1901 138/83 mmHg - 85 -  03/30/15 1846 128/74 mmHg - 90 -  03/30/15 1831 142/84 mmHg - 86 -  03/30/15 1825 142/83 mmHg - 89 -  03/30/15 1818 150/95 mmHg 98.2 F (36.8 C) 92 18    A/P: 28 y.o. [id number] at [redacted]w[redacted]d w/ decreased fetal movement and transient elevated BP Reassuring fetal status - BPP 10/10, AFI 17 PIH labs WNL, asymptomatic, BPs normal for last 3 hours of MAU stay (elevated x 3 in first hour, then all normal) Rev'd precautions, call  office  Monday to discuss postdates plan, return to MAU w/ worsening symptoms or concerns about fetal movement

## 2015-03-30 NOTE — MAU Note (Signed)
Pt presents to MAU with complaints of a decrease in fetal movement since last night. Denies any vaginal bleeding or LOF 

## 2015-03-31 DIAGNOSIS — Z3A40 40 weeks gestation of pregnancy: Secondary | ICD-10-CM | POA: Insufficient documentation

## 2015-03-31 DIAGNOSIS — O368190 Decreased fetal movements, unspecified trimester, not applicable or unspecified: Secondary | ICD-10-CM | POA: Insufficient documentation

## 2015-04-01 ENCOUNTER — Encounter (HOSPITAL_COMMUNITY): Payer: Self-pay | Admitting: *Deleted

## 2015-04-01 ENCOUNTER — Telehealth (HOSPITAL_COMMUNITY): Payer: Self-pay | Admitting: *Deleted

## 2015-04-01 NOTE — Telephone Encounter (Signed)
Preadmission screen  

## 2015-04-03 ENCOUNTER — Encounter (HOSPITAL_COMMUNITY): Payer: Self-pay | Admitting: *Deleted

## 2015-04-03 ENCOUNTER — Inpatient Hospital Stay (HOSPITAL_COMMUNITY)
Admission: AD | Admit: 2015-04-03 | Discharge: 2015-04-04 | DRG: 775 | Disposition: A | Discharge status: Home / Self Care | Payer: 59 | Source: Ambulatory Visit | Attending: Obstetrics | Admitting: Obstetrics

## 2015-04-03 DIAGNOSIS — Z3483 Encounter for supervision of other normal pregnancy, third trimester: Secondary | ICD-10-CM | POA: Diagnosis present

## 2015-04-03 DIAGNOSIS — Z3A30 30 weeks gestation of pregnancy: Secondary | ICD-10-CM | POA: Diagnosis present

## 2015-04-03 DIAGNOSIS — Z3A40 40 weeks gestation of pregnancy: Secondary | ICD-10-CM

## 2015-04-03 DIAGNOSIS — IMO0001 Reserved for inherently not codable concepts without codable children: Secondary | ICD-10-CM

## 2015-04-03 LAB — TYPE AND SCREEN
ABO/RH(D): A POS
Antibody Screen: NEGATIVE

## 2015-04-03 LAB — CBC
HCT: 31.8 % — ABNORMAL LOW (ref 36.0–46.0)
Hemoglobin: 10.4 g/dL — ABNORMAL LOW (ref 12.0–15.0)
MCH: 27.8 pg (ref 26.0–34.0)
MCHC: 32.7 g/dL (ref 30.0–36.0)
MCV: 85 fL (ref 78.0–100.0)
PLATELETS: 187 10*3/uL (ref 150–400)
RBC: 3.74 MIL/uL — AB (ref 3.87–5.11)
RDW: 14 % (ref 11.5–15.5)
WBC: 8 10*3/uL (ref 4.0–10.5)

## 2015-04-03 LAB — RPR: RPR Ser Ql: NONREACTIVE

## 2015-04-03 MED ORDER — LIDOCAINE HCL (PF) 1 % IJ SOLN
30.0000 mL | INTRAMUSCULAR | Status: DC | PRN
  Administered 2015-04-03: 30 mL via SUBCUTANEOUS
  Filled 2015-04-03: qty 30

## 2015-04-03 MED ORDER — OXYTOCIN 40 UNITS IN LACTATED RINGERS INFUSION - SIMPLE MED
62.5000 mL/h | INTRAVENOUS | Status: DC
  Filled 2015-04-03: qty 1000

## 2015-04-03 MED ORDER — OXYCODONE-ACETAMINOPHEN 5-325 MG PO TABS: 1.0000 | ORAL_TABLET | ORAL | Status: DC | PRN

## 2015-04-03 MED ORDER — OXYTOCIN 10 UNIT/ML IJ SOLN
10.0000 [IU] | Freq: Once | INTRAMUSCULAR | Status: AC
  Administered 2015-04-03: 10 [IU] via INTRAMUSCULAR

## 2015-04-03 MED ORDER — WITCH HAZEL-GLYCERIN EX PADS: 1.0000 "application " | MEDICATED_PAD | CUTANEOUS | Status: DC | PRN

## 2015-04-03 MED ORDER — LANOLIN HYDROUS EX OINT: TOPICAL_OINTMENT | CUTANEOUS | Status: DC | PRN

## 2015-04-03 MED ORDER — DIBUCAINE 1 % RE OINT: 1.0000 "application " | TOPICAL_OINTMENT | RECTAL | Status: DC | PRN

## 2015-04-03 MED ORDER — BENZOCAINE-MENTHOL 20-0.5 % EX AERO: 1.0000 "application " | INHALATION_SPRAY | CUTANEOUS | Status: DC | PRN

## 2015-04-03 MED ORDER — OXYTOCIN 10 UNIT/ML IJ SOLN
INTRAMUSCULAR | Status: AC
  Filled 2015-04-03: qty 1

## 2015-04-03 MED ORDER — CITRIC ACID-SODIUM CITRATE 334-500 MG/5ML PO SOLN: 30.0000 mL | ORAL | Status: DC | PRN

## 2015-04-03 MED ORDER — ONDANSETRON HCL 4 MG/2ML IJ SOLN: 4.0000 mg | Freq: Four times a day (QID) | INTRAMUSCULAR | Status: DC | PRN

## 2015-04-03 MED ORDER — LACTATED RINGERS IV SOLN
500.0000 mL | INTRAVENOUS | Status: DC | PRN
  Administered 2015-04-03: 500 mL via INTRAVENOUS

## 2015-04-03 MED ORDER — OXYCODONE-ACETAMINOPHEN 5-325 MG PO TABS: 2.0000 | ORAL_TABLET | ORAL | Status: DC | PRN

## 2015-04-03 MED ORDER — OXYTOCIN BOLUS FROM INFUSION: 500.0000 mL | INTRAVENOUS | Status: DC

## 2015-04-03 MED ORDER — SIMETHICONE 80 MG PO CHEW: 80.0000 mg | CHEWABLE_TABLET | ORAL | Status: DC | PRN

## 2015-04-03 MED ORDER — IBUPROFEN 600 MG PO TABS
600.0000 mg | ORAL_TABLET | Freq: Four times a day (QID) | ORAL | Status: DC
  Administered 2015-04-03 – 2015-04-04 (×4): 600 mg via ORAL
  Filled 2015-04-03 (×4): qty 1

## 2015-04-03 MED ORDER — TETANUS-DIPHTH-ACELL PERTUSSIS 5-2.5-18.5 LF-MCG/0.5 IM SUSP: 0.5000 mL | Freq: Once | INTRAMUSCULAR | Status: DC

## 2015-04-03 MED ORDER — ONDANSETRON HCL 4 MG/2ML IJ SOLN: 4.0000 mg | INTRAMUSCULAR | Status: DC | PRN

## 2015-04-03 MED ORDER — PRENATAL MULTIVITAMIN CH
1.0000 | ORAL_TABLET | Freq: Every day | ORAL | Status: DC
  Administered 2015-04-03: 1 via ORAL
  Filled 2015-04-03: qty 1

## 2015-04-03 MED ORDER — LACTATED RINGERS IV SOLN
INTRAVENOUS | Status: DC
  Administered 2015-04-03: 06:00:00 via INTRAVENOUS

## 2015-04-03 MED ORDER — ONDANSETRON HCL 4 MG PO TABS: 4.0000 mg | ORAL_TABLET | ORAL | Status: DC | PRN

## 2015-04-03 MED ORDER — FLEET ENEMA 7-19 GM/118ML RE ENEM: 1.0000 | ENEMA | RECTAL | Status: DC | PRN

## 2015-04-03 MED ORDER — ACETAMINOPHEN 325 MG PO TABS: 650.0000 mg | ORAL_TABLET | ORAL | Status: DC | PRN

## 2015-04-03 MED ORDER — DIPHENHYDRAMINE HCL 25 MG PO CAPS: 25.0000 mg | ORAL_CAPSULE | Freq: Four times a day (QID) | ORAL | Status: DC | PRN

## 2015-04-03 MED ORDER — SENNOSIDES-DOCUSATE SODIUM 8.6-50 MG PO TABS
2.0000 | ORAL_TABLET | ORAL | Status: DC
  Administered 2015-04-04: 2 via ORAL
  Filled 2015-04-03: qty 2

## 2015-04-03 NOTE — MAU Note (Signed)
Pt reports contractions all night and at 530 am she had a gush of bright red bleeding.

## 2015-04-03 NOTE — Lactation Note (Signed)
This note was copied from the chart of Glenda Melvyn NovasSusan Crosby. Lactation Consultation Note  Patient Name: Glenda Crosby ZOXWR'UToday's Date: 04/03/2015 Reason for consult: Initial assessment of this mom and baby at 14 hours pp.  Mom is a multipara with previous breastfeeding experience with 2 older children, now 1 and 31 yo.  Mom says they each nursed for 8-9 months.  Mom says she is able to hand express her milk, as needed.  Mom denies any latching problems with her new baby.  LC encouraged frequent STS and cue feedings.  Baby had LATCH score of "9" per RN assessment and multiple feedings as well as a first void and first stool. Mom encouraged to feed baby 8-12 times/24 hours and with feeding cues. LC encouraged review of Baby and Me pp 9, 14 and 20-25 for STS and BF information. LC provided [employer] Resource brochure and reviewed [facility] services and list of community and web site resources.     Maternal Data Formula Feeding for Exclusion: No Has patient been taught Hand Expression?: Yes (experienced breastfeeding mom) Does the patient have breastfeeding experience prior to this delivery?: Yes  Feeding    LATCH Score/Interventions               LATCH score=9 per RN assessment       Lactation Tools Discussed/Used   STS, cue feedings, hand expression  Consult Status Consult Status: Follow-up Date: 04/04/15 Follow-up type: In-patient    Warrick ParisianBryant, Derrall Hicks [facility] 04/03/2015, 10:44 PM
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
## 2015-04-03 NOTE — MAU Note (Signed)
Contractions every 2.5-3 minutes. Complains of bright red vaginal bleeding.  Positive fetal movement.  Denies SROM/LOF  Denies any Complications of pregnancy  GBS negative per patient

## 2015-04-04 LAB — CBC
HCT: 27.6 % — ABNORMAL LOW (ref 36.0–46.0)
Hemoglobin: 8.9 g/dL — ABNORMAL LOW (ref 12.0–15.0)
MCH: 27.8 pg (ref 26.0–34.0)
MCHC: 32.2 g/dL (ref 30.0–36.0)
MCV: 86.3 fL (ref 78.0–100.0)
PLATELETS: 153 10*3/uL (ref 150–400)
RBC: 3.2 MIL/uL — ABNORMAL LOW (ref 3.87–5.11)
RDW: 13.9 % (ref 11.5–15.5)
WBC: 7 10*3/uL (ref 4.0–10.5)

## 2015-04-04 MED ORDER — OXYCODONE-ACETAMINOPHEN 5-325 MG PO TABS: 2.0000 | ORAL_TABLET | ORAL | Status: DC | PRN

## 2015-04-04 NOTE — Progress Notes (Signed)
PPD#1 Pt and baby are doing well She would like to go home.  VSSAF IMP/stable Plan/ will discharge.
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
## 2015-04-04 NOTE — Discharge Summary (Signed)
Obstetric Discharge Summary Reason for Admission: onset of labor Prenatal Procedures: ultrasound Intrapartum Procedures: spontaneous vaginal delivery Postpartum Procedures: none Complications-Operative and Postpartum: 1st degree perineal laceration HEMOGLOBIN  Date Value Ref Range Status  04/04/2015 8.9* 12.0 - 15.0 g/dL Final   HCT  Date Value Ref Range Status  04/04/2015 27.6* 36.0 - 46.0 % Final    Physical Exam:  General: alert Lochia: appropriate Uterine Fundus: firm   Discharge Diagnoses: Term Pregnancy-delivered  Discharge Information: Date: 04/04/2015 Activity: pelvic rest Diet: routine Medications: PNV, Ibuprofen and Percocet Condition: stable Instructions: refer to practice specific booklet Discharge to: home Follow-up Information    [facility] Chestine SporeLARK, MD. Schedule an appointment as soon as possible for a visit in 1 month.   Specialty:  Obstetrics   Contact information:   [address] [place] [id number] [phone]       Newborn Data: Live born female  Birth Weight: 9 lb 5 oz (4224 g) APGAR: 7, 9  Home with mother.  Glenda Crosby 04/04/2015, 9:34 AM

## 2015-04-08 ENCOUNTER — Inpatient Hospital Stay (HOSPITAL_COMMUNITY): Admission: RE | Admit: 2015-04-08 | Payer: 59 | Source: Ambulatory Visit

## 2015-12-22 NOTE — L&D Delivery Note (Signed)
Patient was C/C and pushed for <1 minute with epidural.   NSVD female infant, Apgars 9/9, weight pending.   The patient had no laceration. Fundus was firm. EBL was nil. Placenta was delivered intact. Vagina was clear.  Baby was vigorous and doing skin to skin with mother.  Philip Crosby, Glenda Danser

## 2016-05-04 ENCOUNTER — Other Ambulatory Visit: Payer: Self-pay | Admitting: Obstetrics and Gynecology

## 2016-05-04 DIAGNOSIS — Z3480 Encounter for supervision of other normal pregnancy, unspecified trimester: Secondary | ICD-10-CM | POA: Diagnosis not present

## 2016-05-04 DIAGNOSIS — Z01419 Encounter for gynecological examination (general) (routine) without abnormal findings: Secondary | ICD-10-CM | POA: Diagnosis not present

## 2016-05-04 DIAGNOSIS — Z3201 Encounter for pregnancy test, result positive: Secondary | ICD-10-CM | POA: Diagnosis not present

## 2016-05-04 DIAGNOSIS — Z124 Encounter for screening for malignant neoplasm of cervix: Secondary | ICD-10-CM | POA: Diagnosis not present

## 2016-05-04 DIAGNOSIS — Z36 Encounter for antenatal screening of mother: Secondary | ICD-10-CM | POA: Diagnosis not present

## 2016-05-04 DIAGNOSIS — Z6832 Body mass index (BMI) 32.0-32.9, adult: Secondary | ICD-10-CM | POA: Diagnosis not present

## 2016-05-04 LAB — OB RESULTS CONSOLE GC/CHLAMYDIA
CHLAMYDIA, DNA PROBE: NEGATIVE
Gonorrhea: NEGATIVE

## 2016-05-04 LAB — OB RESULTS CONSOLE HIV ANTIBODY (ROUTINE TESTING): HIV: NONREACTIVE

## 2016-05-04 LAB — OB RESULTS CONSOLE HEPATITIS B SURFACE ANTIGEN: HEP B S AG: NEGATIVE

## 2016-05-04 LAB — OB RESULTS CONSOLE RUBELLA ANTIBODY, IGM: Rubella: IMMUNE

## 2016-05-04 LAB — OB RESULTS CONSOLE ABO/RH: RH TYPE: POSITIVE

## 2016-05-04 LAB — OB RESULTS CONSOLE ANTIBODY SCREEN: Antibody Screen: NEGATIVE

## 2016-05-04 LAB — OB RESULTS CONSOLE RPR: RPR: NONREACTIVE

## 2016-05-05 LAB — CYTOLOGY - PAP

## 2016-06-22 DIAGNOSIS — Z36 Encounter for antenatal screening of mother: Secondary | ICD-10-CM | POA: Diagnosis not present

## 2016-08-20 DIAGNOSIS — Z3480 Encounter for supervision of other normal pregnancy, unspecified trimester: Secondary | ICD-10-CM | POA: Diagnosis not present

## 2016-10-14 ENCOUNTER — Other Ambulatory Visit: Payer: Self-pay | Admitting: Obstetrics & Gynecology

## 2016-10-14 DIAGNOSIS — Z369 Encounter for antenatal screening, unspecified: Secondary | ICD-10-CM | POA: Diagnosis not present

## 2016-10-14 DIAGNOSIS — Z3480 Encounter for supervision of other normal pregnancy, unspecified trimester: Secondary | ICD-10-CM | POA: Diagnosis not present

## 2016-10-14 LAB — OB RESULTS CONSOLE GBS: GBS: NEGATIVE

## 2016-10-21 DIAGNOSIS — H53031 Strabismic amblyopia, right eye: Secondary | ICD-10-CM | POA: Diagnosis not present

## 2016-10-21 DIAGNOSIS — H5015 Alternating exotropia: Secondary | ICD-10-CM | POA: Diagnosis not present

## 2016-10-21 DIAGNOSIS — H5203 Hypermetropia, bilateral: Secondary | ICD-10-CM | POA: Diagnosis not present

## 2016-11-18 ENCOUNTER — Encounter (HOSPITAL_COMMUNITY): Payer: Self-pay

## 2016-11-18 ENCOUNTER — Inpatient Hospital Stay (HOSPITAL_COMMUNITY)
Admission: AD | Admit: 2016-11-18 | Discharge: 2016-11-21 | DRG: 775 | Disposition: A | Discharge status: Home / Self Care | Payer: 59 | Source: Ambulatory Visit | Attending: Obstetrics and Gynecology | Admitting: Obstetrics and Gynecology

## 2016-11-18 DIAGNOSIS — Z3493 Encounter for supervision of normal pregnancy, unspecified, third trimester: Secondary | ICD-10-CM | POA: Diagnosis present

## 2016-11-18 DIAGNOSIS — Z3A40 40 weeks gestation of pregnancy: Secondary | ICD-10-CM | POA: Diagnosis not present

## 2016-11-18 DIAGNOSIS — Z3490 Encounter for supervision of normal pregnancy, unspecified, unspecified trimester: Secondary | ICD-10-CM

## 2016-11-18 MED ORDER — ONDANSETRON HCL 4 MG/2ML IJ SOLN: 4.0000 mg | Freq: Four times a day (QID) | INTRAMUSCULAR | Status: DC | PRN

## 2016-11-18 MED ORDER — LIDOCAINE HCL (PF) 1 % IJ SOLN
30.0000 mL | INTRAMUSCULAR | Status: DC | PRN
  Filled 2016-11-18: qty 30

## 2016-11-18 MED ORDER — OXYTOCIN 40 UNITS IN LACTATED RINGERS INFUSION - SIMPLE MED
2.5000 [IU]/h | INTRAVENOUS | Status: DC
  Filled 2016-11-18: qty 1000

## 2016-11-18 MED ORDER — OXYTOCIN BOLUS FROM INFUSION
500.0000 mL | Freq: Once | INTRAVENOUS | Status: AC
  Administered 2016-11-19: 500 mL via INTRAVENOUS

## 2016-11-18 MED ORDER — ACETAMINOPHEN 325 MG PO TABS: 650.0000 mg | ORAL_TABLET | ORAL | Status: DC | PRN

## 2016-11-18 MED ORDER — LACTATED RINGERS IV SOLN
INTRAVENOUS | Status: DC
  Administered 2016-11-19: via INTRAVENOUS

## 2016-11-18 MED ORDER — SOD CITRATE-CITRIC ACID 500-334 MG/5ML PO SOLN: 30.0000 mL | ORAL | Status: DC | PRN

## 2016-11-18 MED ORDER — OXYCODONE-ACETAMINOPHEN 5-325 MG PO TABS: 1.0000 | ORAL_TABLET | ORAL | Status: DC | PRN

## 2016-11-18 MED ORDER — OXYCODONE-ACETAMINOPHEN 5-325 MG PO TABS: 2.0000 | ORAL_TABLET | ORAL | Status: DC | PRN

## 2016-11-18 MED ORDER — LACTATED RINGERS IV SOLN: 500.0000 mL | INTRAVENOUS | Status: DC | PRN
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
## 2016-11-18 NOTE — MAU Note (Signed)
Pt presents with contractions 

## 2016-11-19 ENCOUNTER — Encounter (HOSPITAL_COMMUNITY): Payer: Self-pay | Admitting: *Deleted

## 2016-11-19 LAB — TYPE AND SCREEN
ABO/RH(D): A POS
Antibody Screen: NEGATIVE

## 2016-11-19 LAB — CBC
HCT: 36.6 % (ref 36.0–46.0)
HEMATOCRIT: 35.8 % — AB (ref 36.0–46.0)
Hemoglobin: 11.8 g/dL — ABNORMAL LOW (ref 12.0–15.0)
Hemoglobin: 12.6 g/dL (ref 12.0–15.0)
MCH: 28 pg (ref 26.0–34.0)
MCH: 29.1 pg (ref 26.0–34.0)
MCHC: 33 g/dL (ref 30.0–36.0)
MCHC: 34.4 g/dL (ref 30.0–36.0)
MCV: 84.5 fL (ref 78.0–100.0)
MCV: 84.8 fL (ref 78.0–100.0)
PLATELETS: 221 10*3/uL (ref 150–400)
Platelets: 227 10*3/uL (ref 150–400)
RBC: 4.22 MIL/uL (ref 3.87–5.11)
RBC: 4.33 MIL/uL (ref 3.87–5.11)
RDW: 14.6 % (ref 11.5–15.5)
RDW: 14.8 % (ref 11.5–15.5)
WBC: 10.3 10*3/uL (ref 4.0–10.5)
WBC: 8.5 10*3/uL (ref 4.0–10.5)

## 2016-11-19 LAB — RPR: RPR: NONREACTIVE

## 2016-11-19 MED ORDER — IBUPROFEN 600 MG PO TABS
600.0000 mg | ORAL_TABLET | Freq: Four times a day (QID) | ORAL | Status: DC
  Administered 2016-11-19 – 2016-11-21 (×11): 600 mg via ORAL
  Filled 2016-11-19 (×11): qty 1

## 2016-11-19 MED ORDER — SIMETHICONE 80 MG PO CHEW: 80.0000 mg | CHEWABLE_TABLET | ORAL | Status: DC | PRN

## 2016-11-19 MED ORDER — ZOLPIDEM TARTRATE 5 MG PO TABS: 5.0000 mg | ORAL_TABLET | Freq: Every evening | ORAL | Status: DC | PRN

## 2016-11-19 MED ORDER — OXYCODONE-ACETAMINOPHEN 5-325 MG PO TABS: 2.0000 | ORAL_TABLET | ORAL | Status: DC | PRN

## 2016-11-19 MED ORDER — WITCH HAZEL-GLYCERIN EX PADS: 1.0000 "application " | MEDICATED_PAD | CUTANEOUS | Status: DC | PRN

## 2016-11-19 MED ORDER — OXYCODONE-ACETAMINOPHEN 5-325 MG PO TABS: 1.0000 | ORAL_TABLET | ORAL | Status: DC | PRN

## 2016-11-19 MED ORDER — BENZOCAINE-MENTHOL 20-0.5 % EX AERO: 1.0000 "application " | INHALATION_SPRAY | CUTANEOUS | Status: DC | PRN

## 2016-11-19 MED ORDER — COCONUT OIL OIL: 1.0000 "application " | TOPICAL_OIL | Status: DC | PRN

## 2016-11-19 MED ORDER — DIBUCAINE 1 % RE OINT: 1.0000 "application " | TOPICAL_OINTMENT | RECTAL | Status: DC | PRN

## 2016-11-19 MED ORDER — DIPHENHYDRAMINE HCL 25 MG PO CAPS: 25.0000 mg | ORAL_CAPSULE | Freq: Four times a day (QID) | ORAL | Status: DC | PRN

## 2016-11-19 MED ORDER — ONDANSETRON HCL 4 MG PO TABS: 4.0000 mg | ORAL_TABLET | ORAL | Status: DC | PRN

## 2016-11-19 MED ORDER — SENNOSIDES-DOCUSATE SODIUM 8.6-50 MG PO TABS
2.0000 | ORAL_TABLET | ORAL | Status: DC
  Administered 2016-11-19 – 2016-11-20 (×2): 2 via ORAL
  Filled 2016-11-19 (×2): qty 2

## 2016-11-19 MED ORDER — ACETAMINOPHEN 325 MG PO TABS: 650.0000 mg | ORAL_TABLET | ORAL | Status: DC | PRN

## 2016-11-19 MED ORDER — TETANUS-DIPHTH-ACELL PERTUSSIS 5-2.5-18.5 LF-MCG/0.5 IM SUSP: 0.5000 mL | Freq: Once | INTRAMUSCULAR | Status: DC

## 2016-11-19 MED ORDER — PRENATAL MULTIVITAMIN CH
1.0000 | ORAL_TABLET | Freq: Every day | ORAL | Status: DC
  Administered 2016-11-19 – 2016-11-21 (×3): 1 via ORAL
  Filled 2016-11-19 (×3): qty 1

## 2016-11-19 MED ORDER — ONDANSETRON HCL 4 MG/2ML IJ SOLN: 4.0000 mg | INTRAMUSCULAR | Status: DC | PRN

## 2016-11-19 NOTE — Lactation Note (Signed)
This note was copied from a baby's chart. Lactation Consultation Note Initial visit at 11 hours of age.  Mom reports a good feeding after delivery and then baby has been sleepy.  Mom is experienced with 3 older children breastfeeding well.  Mom reports trying recently to latch baby and declines assist at this time.  Mom reports doing hand expression with rust colored colostrum expressed easily.  [facility] resources given and discussed.  Encouraged to feed with early cues on demand.  Early newborn behavior discussed.  LC discussed milk volume increasing and engorgement care briefly.  LC follow up as needed.   Mom to call for assist as needed.    Patient Name: Glenda Crosby QMVHQ'IToday's Date: 11/19/2016 Reason for consult: Initial assessment   Maternal Data Has patient been taught Hand Expression?: Yes Does the patient have breastfeeding experience prior to this delivery?: Yes  Feeding Feeding Type: Breast Fed Length of feed: 0 min  LATCH Score/Interventions                Intervention(s): Breastfeeding basics reviewed;Position options     Lactation Tools Discussed/Used     Consult Status Consult Status: PRN    Beverely RisenShoptaw, Arvella MerlesJana Lynn 11/19/2016, 11:35 AM

## 2016-11-19 NOTE — Progress Notes (Signed)
Patient is eating, ambulating, voiding.  Pain control is good.  Vitals:   11/19/16 0035 11/19/16 0150 11/19/16 0330 11/19/16 0600  BP:  111/67 132/61 107/60  Pulse:  60 63 61  Resp: 18 20 20 18   Temp:  98.1 F (36.7 C) 98.3 F (36.8 C) 98.2 F (36.8 C)  TempSrc:  Oral Oral Oral  SpO2:   100% 99%  Weight:      Height:        Fundus firm Perineum without swelling.  Lab Results  Component Value Date   WBC 10.3 11/19/2016   HGB 11.8 (L) 11/19/2016   HCT 35.8 (L) 11/19/2016   MCV 84.8 11/19/2016   PLT 227 11/19/2016    --/--/A POS (11/29 2355)/RI  A/P Post partum day 0.  Routine care.  Expect d/c routine.    Mccrae Speciale A

## 2016-11-19 NOTE — Progress Notes (Addendum)
Please see CSW note dated for 04/04/15.   Arturo Freundlich Boyd-Gilyard, MSW, LCSW Clinical Social Work [phone]  

## 2016-11-20 NOTE — Progress Notes (Signed)
Post Partum Day 1 Subjective: no complaints, up ad lib, voiding, tolerating PO, + flatus and breast feeding  Objective: Blood pressure (!) 123/59, pulse 65, temperature 98.3 F (36.8 C), temperature source Oral, resp. rate 18, height 5\' 4"  (1.626 m), weight 97.5 kg (215 lb), SpO2 99 %, unknown if currently breastfeeding.  Physical Exam:  General: alert, cooperative and no distress Lochia: appropriate Uterine Fundus: firm perineum: healing well, no significant drainage, no dehiscence, no significant erythema DVT Evaluation: No evidence of DVT seen on physical exam. Negative Homan's sign. No cords or calf tenderness. No significant calf/ankle edema.   Recent Labs  11/18/16 2355 11/19/16 0606  HGB 12.6 11.8*  HCT 36.6 35.8*    Assessment/Plan: Plan for discharge tomorrow, Breastfeeding and Circumcision prior to discharge   LOS: 2 days   Glenda Crosby STACIA 11/20/2016, 10:21 AM
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
## 2016-11-20 NOTE — Progress Notes (Signed)
UR chart review completed.  

## 2016-11-21 ENCOUNTER — Ambulatory Visit: Payer: Self-pay

## 2016-11-21 MED ORDER — IBUPROFEN 600 MG PO TABS: 600.0000 mg | ORAL_TABLET | Freq: Four times a day (QID) | ORAL | 2 refills | Status: AC

## 2016-11-21 NOTE — Discharge Summary (Signed)
Obstetric Discharge Summary Reason for Admission: onset of labor Prenatal Procedures: none Intrapartum Procedures: spontaneous vaginal delivery Postpartum Procedures: none Complications-Operative and Postpartum: none Hemoglobin  Date Value Ref Range Status  11/19/2016 11.8 (L) 12.0 - 15.0 g/dL Final   HCT  Date Value Ref Range Status  11/19/2016 35.8 (L) 36.0 - 46.0 % Final    Physical Exam:  General: alert, cooperative and no distress Lochia: appropriate Uterine Fundus: firm perineum: healing well, no significant erythema DVT Evaluation: No evidence of DVT seen on physical exam. Negative Homan's sign. No cords or calf tenderness. No significant calf/ankle edema.  Discharge Diagnoses: Term Pregnancy-delivered  Discharge Information: Date: 11/21/2016 Activity: pelvic rest Diet: routine Medications: PNV and Ibuprofen Condition: stable Instructions: refer to practice specific booklet Discharge to: home   Newborn Data: Live born female  Birth Weight: 7 lb 9.7 oz (3450 g) APGAR: 9, 9  Home with mother.  Essie HartINN, Angell Honse STACIA 11/21/2016, 11:56 AM

## 2016-11-21 NOTE — Progress Notes (Signed)
Post Partum Day 2 Subjective: no complaints, up ad lib, voiding, tolerating PO, + flatus and breast feeding  Objective: Blood pressure 120/60, pulse (!) 59, temperature 98.3 F (36.8 C), temperature source Oral, resp. rate 18, height 5\' 4"  (1.626 m), weight 97.5 kg (215 lb), SpO2 99 %, unknown if currently breastfeeding.  Physical Exam:  General: alert, cooperative and no distress Lochia: appropriate Uterine Fundus: firm perineum: healing well, no significant erythema DVT Evaluation: No evidence of DVT seen on physical exam. Negative Homan's sign. No cords or calf tenderness. No significant calf/ankle edema.   Recent Labs  11/18/16 2355 11/19/16 0606  HGB 12.6 11.8*  HCT 36.6 35.8*    Assessment/Plan: Discharge home and Breastfeeding   LOS: 3 days   Glenda Crosby STACIA 11/21/2016, 11:53 AM

## 2016-11-21 NOTE — Lactation Note (Signed)
This note was copied from a baby's chart. Lactation Consultation Note  Patient Name: Glenda Melvyn NovasSusan Masten YNWGN'FToday's Date: 11/21/2016  Follow up visit made prior to discharge.  Mom states feedings are going better.  Discussed milk coming to volume and engorgement treatment.  Mom states she usually has an over supply and needs to pump for comfort.  Patient is an employee and provided with a [employer].  Lactation outpatient services and support information reviewed and encouraged.   Maternal Data    Feeding Feeding Type: Breast Fed Length of feed: 2 min  LATCH Score/Interventions Latch: Repeated attempts needed to sustain latch, nipple held in mouth throughout feeding, stimulation needed to elicit sucking reflex. Intervention(s): Skin to skin;Teach feeding cues;Waking techniques Intervention(s): Adjust position;Assist with latch;Breast massage;Breast compression  Audible Swallowing: A few with stimulation Intervention(s): Skin to skin;Hand expression Intervention(s): Skin to skin;Hand expression;Alternate breast massage  Type of Nipple: Everted at rest and after stimulation  Comfort (Breast/Nipple): Soft / non-tender     Hold (Positioning): Assistance needed to correctly position infant at breast and maintain latch. Intervention(s): Breastfeeding basics reviewed;Support Pillows;Position options;Skin to skin  LATCH Score: 7  Lactation Tools Discussed/Used     Consult Status      Huston FoleyMOULDEN, Stokely Jeancharles S 11/21/2016, 12:53 PM

## 2020-03-26 DIAGNOSIS — F4322 Adjustment disorder with anxiety: Secondary | ICD-10-CM | POA: Diagnosis not present

## 2020-04-17 DIAGNOSIS — F4322 Adjustment disorder with anxiety: Secondary | ICD-10-CM | POA: Diagnosis not present

## 2020-05-01 DIAGNOSIS — F4322 Adjustment disorder with anxiety: Secondary | ICD-10-CM | POA: Diagnosis not present

## 2020-05-14 DIAGNOSIS — F4322 Adjustment disorder with anxiety: Secondary | ICD-10-CM | POA: Diagnosis not present

## 2020-05-27 DIAGNOSIS — F4322 Adjustment disorder with anxiety: Secondary | ICD-10-CM | POA: Diagnosis not present

## 2020-06-13 DIAGNOSIS — F4322 Adjustment disorder with anxiety: Secondary | ICD-10-CM | POA: Diagnosis not present

## 2020-07-04 DIAGNOSIS — F4322 Adjustment disorder with anxiety: Secondary | ICD-10-CM | POA: Diagnosis not present

## 2020-07-18 DIAGNOSIS — F4322 Adjustment disorder with anxiety: Secondary | ICD-10-CM | POA: Diagnosis not present

## 2020-09-05 DIAGNOSIS — F4322 Adjustment disorder with anxiety: Secondary | ICD-10-CM | POA: Diagnosis not present

## 2020-10-03 DIAGNOSIS — F4322 Adjustment disorder with anxiety: Secondary | ICD-10-CM | POA: Diagnosis not present

## 2020-10-31 DIAGNOSIS — F4322 Adjustment disorder with anxiety: Secondary | ICD-10-CM | POA: Diagnosis not present

## 2020-11-28 DIAGNOSIS — F4322 Adjustment disorder with anxiety: Secondary | ICD-10-CM | POA: Diagnosis not present

## 2021-01-02 DIAGNOSIS — F4322 Adjustment disorder with anxiety: Secondary | ICD-10-CM | POA: Diagnosis not present

## 2021-02-06 DIAGNOSIS — F4322 Adjustment disorder with anxiety: Secondary | ICD-10-CM | POA: Diagnosis not present

## 2021-03-07 DIAGNOSIS — F4322 Adjustment disorder with anxiety: Secondary | ICD-10-CM | POA: Diagnosis not present

## 2021-04-04 DIAGNOSIS — F4322 Adjustment disorder with anxiety: Secondary | ICD-10-CM | POA: Diagnosis not present

## 2021-05-09 DIAGNOSIS — F4322 Adjustment disorder with anxiety: Secondary | ICD-10-CM | POA: Diagnosis not present

## 2021-06-11 DIAGNOSIS — F4322 Adjustment disorder with anxiety: Secondary | ICD-10-CM | POA: Diagnosis not present

## 2021-07-10 DIAGNOSIS — F4322 Adjustment disorder with anxiety: Secondary | ICD-10-CM | POA: Diagnosis not present

## 2021-09-10 DIAGNOSIS — Z0000 Encounter for general adult medical examination without abnormal findings: Secondary | ICD-10-CM | POA: Diagnosis not present

## 2021-09-23 DIAGNOSIS — F4322 Adjustment disorder with anxiety: Secondary | ICD-10-CM | POA: Diagnosis not present

## 2021-10-21 DIAGNOSIS — F4322 Adjustment disorder with anxiety: Secondary | ICD-10-CM | POA: Diagnosis not present

## 2021-11-18 DIAGNOSIS — F4322 Adjustment disorder with anxiety: Secondary | ICD-10-CM | POA: Diagnosis not present

## 2021-12-16 DIAGNOSIS — F4322 Adjustment disorder with anxiety: Secondary | ICD-10-CM | POA: Diagnosis not present

## 2021-12-26 ENCOUNTER — Ambulatory Visit: Payer: Self-pay

## 2021-12-26 ENCOUNTER — Other Ambulatory Visit: Payer: Self-pay

## 2021-12-26 ENCOUNTER — Other Ambulatory Visit: Payer: Self-pay | Admitting: Occupational Medicine

## 2021-12-26 DIAGNOSIS — S0033XA Contusion of nose, initial encounter: Secondary | ICD-10-CM

## 2022-01-22 DIAGNOSIS — Z01419 Encounter for gynecological examination (general) (routine) without abnormal findings: Secondary | ICD-10-CM | POA: Diagnosis not present

## 2022-09-25 DIAGNOSIS — Z0000 Encounter for general adult medical examination without abnormal findings: Secondary | ICD-10-CM | POA: Diagnosis not present

## 2022-09-25 DIAGNOSIS — Z13220 Encounter for screening for lipoid disorders: Secondary | ICD-10-CM | POA: Diagnosis not present

## 2022-09-25 DIAGNOSIS — R030 Elevated blood-pressure reading, without diagnosis of hypertension: Secondary | ICD-10-CM | POA: Diagnosis not present

## 2022-09-25 DIAGNOSIS — Z1329 Encounter for screening for other suspected endocrine disorder: Secondary | ICD-10-CM | POA: Diagnosis not present

## 2022-09-25 DIAGNOSIS — Z6841 Body Mass Index (BMI) 40.0 and over, adult: Secondary | ICD-10-CM | POA: Diagnosis not present

## 2022-09-25 DIAGNOSIS — Z13228 Encounter for screening for other metabolic disorders: Secondary | ICD-10-CM | POA: Diagnosis not present

## 2022-09-25 DIAGNOSIS — Z130 Encounter for screening for diseases of the blood and blood-forming organs and certain disorders involving the immune mechanism: Secondary | ICD-10-CM | POA: Diagnosis not present

## 2022-09-25 DIAGNOSIS — D649 Anemia, unspecified: Secondary | ICD-10-CM | POA: Diagnosis not present

## 2022-09-25 DIAGNOSIS — Z1389 Encounter for screening for other disorder: Secondary | ICD-10-CM | POA: Diagnosis not present

## 2022-10-01 ENCOUNTER — Ambulatory Visit (INDEPENDENT_AMBULATORY_CARE_PROVIDER_SITE_OTHER): Payer: 59 | Admitting: Family Medicine

## 2022-10-01 ENCOUNTER — Encounter (INDEPENDENT_AMBULATORY_CARE_PROVIDER_SITE_OTHER): Payer: Self-pay | Admitting: Family Medicine

## 2022-10-01 VITALS — BP 144/79 | HR 77 | Temp 98.0°F | Ht 64.0 in | Wt 239.0 lb

## 2022-10-01 DIAGNOSIS — E7849 Other hyperlipidemia: Secondary | ICD-10-CM | POA: Diagnosis not present

## 2022-10-01 DIAGNOSIS — Z0289 Encounter for other administrative examinations: Secondary | ICD-10-CM

## 2022-10-01 DIAGNOSIS — R030 Elevated blood-pressure reading, without diagnosis of hypertension: Secondary | ICD-10-CM | POA: Diagnosis not present

## 2022-10-01 DIAGNOSIS — Z6841 Body Mass Index (BMI) 40.0 and over, adult: Secondary | ICD-10-CM | POA: Diagnosis not present

## 2022-10-01 DIAGNOSIS — D508 Other iron deficiency anemias: Secondary | ICD-10-CM | POA: Diagnosis not present

## 2022-10-16 ENCOUNTER — Other Ambulatory Visit (HOSPITAL_COMMUNITY): Payer: Self-pay

## 2022-10-16 MED ORDER — CIPROFLOXACIN HCL 500 MG PO TABS
500.0000 mg | ORAL_TABLET | Freq: Once | ORAL | 0 refills | Status: AC
  Filled 2022-10-16: qty 1, 1d supply, fill #0

## 2022-10-27 ENCOUNTER — Ambulatory Visit (INDEPENDENT_AMBULATORY_CARE_PROVIDER_SITE_OTHER): Payer: 59 | Admitting: Family Medicine

## 2022-10-27 ENCOUNTER — Encounter (INDEPENDENT_AMBULATORY_CARE_PROVIDER_SITE_OTHER): Payer: Self-pay | Admitting: Family Medicine

## 2022-10-27 VITALS — BP 136/88 | HR 74 | Temp 98.6°F | Ht 64.0 in | Wt 241.0 lb

## 2022-10-27 DIAGNOSIS — D508 Other iron deficiency anemias: Secondary | ICD-10-CM

## 2022-10-27 DIAGNOSIS — E669 Obesity, unspecified: Secondary | ICD-10-CM | POA: Diagnosis not present

## 2022-10-27 DIAGNOSIS — R0602 Shortness of breath: Secondary | ICD-10-CM | POA: Diagnosis not present

## 2022-10-27 DIAGNOSIS — R030 Elevated blood-pressure reading, without diagnosis of hypertension: Secondary | ICD-10-CM

## 2022-10-27 DIAGNOSIS — R5383 Other fatigue: Secondary | ICD-10-CM | POA: Diagnosis not present

## 2022-10-27 DIAGNOSIS — E7849 Other hyperlipidemia: Secondary | ICD-10-CM

## 2022-10-27 DIAGNOSIS — Z1331 Encounter for screening for depression: Secondary | ICD-10-CM

## 2022-10-27 DIAGNOSIS — Z6841 Body Mass Index (BMI) 40.0 and over, adult: Secondary | ICD-10-CM | POA: Diagnosis not present

## 2022-10-28 LAB — T3: T3, Total: 128 ng/dL (ref 71–180)

## 2022-10-28 LAB — VITAMIN D 25 HYDROXY (VIT D DEFICIENCY, FRACTURES): Vit D, 25-Hydroxy: 25.3 ng/mL — ABNORMAL LOW (ref 30.0–100.0)

## 2022-10-28 LAB — INSULIN, RANDOM: INSULIN: 18.6 u[IU]/mL (ref 2.6–24.9)

## 2022-10-28 LAB — HEMOGLOBIN A1C
Est. average glucose Bld gHb Est-mCnc: 114 mg/dL
Hgb A1c MFr Bld: 5.6 % (ref 4.8–5.6)

## 2022-10-28 LAB — T4, FREE: Free T4: 1.05 ng/dL (ref 0.82–1.77)

## 2022-11-03 NOTE — Progress Notes (Signed)
Chief Complaint:   OBESITY FRANCELLA Crosby (MR# [id number]) is a 36 Crosby.o. female who presents for evaluation and treatment of obesity and related comorbidities. Current BMI is Body mass index is 41.37 kg/m. Glenda Crosby has been struggling with her weight for many years and has been unsuccessful in either losing weight, maintaining weight loss, or reaching her healthy weight goal.  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 Last time she was at goal was 36 yrs old. She gets very restrictive when calorie counting. She does not eat meat and does not eat many reget produces. Skips breakfast or overnight oats with Isopure protein with almond milk (satisfied). Dinner--baked potato or eggs and toast or Morning Star burger.  Glenda Crosby is currently in the action stage of change and ready to dedicate time achieving and maintaining a healthier weight. Glenda Crosby is interested in becoming our patient and working on intensive lifestyle modifications including (but not limited to) diet and exercise for weight loss.  Glenda Crosby's habits were reviewed today and are as follows: Her family eats meals together, she thinks her family will eat healthier with her, her desired weight loss is 117 lbs, she has been heavy most of her life, she started gaining weight in 2008, her heaviest weight ever was 260 pounds, she is a picky eater and doesn't like to eat healthier foods, she has significant food cravings issues, she skips meals frequently, she is trying to follow a vegetarian diet, she is trying to follow a vegan diet, she frequently makes poor food choices, she has problems with excessive hunger, she frequently eats larger portions than normal, and she struggles with emotional eating.  Depression Screen Glenda Crosby's Food and Mood (modified PHQ-9) score was 13.      No data to display         Subjective:   1. Other fatigue Glenda Crosby admits to daytime somnolence and admits to waking up still tired. Patient has a history  of symptoms of morning fatigue. Glenda Crosby generally gets 6 or 7 hours of sleep per night, and states that she has difficulty falling back asleep if awakened. Snoring is not present. Apneic episodes are not present. Epworth Sleepiness Score is 4.    2. SOBOE (shortness of breath on exertion) Haly notes increasing shortness of breath with exercising and seems to be worsening over time with weight gain. She notes getting out of breath sooner with activity than she used to. This has not gotten worse recently. [name] denies shortness of breath at rest or orthopnea.   3. Other iron deficiency anemia Glenda Crosby is now taking PNV. H/H decreased at 11/34.4, MCV 77.  4. Elevated blood pressure reading without diagnosis of hypertension Glenda Crosby's blood pressure is normal today. Denies chest pain, chest pressure and headache.  5. Other hyperlipidemia Glenda Crosby's last LDL of 122, HDL of 51 and Trigly 101. Labs done on 09/25/22.  Assessment/Plan:   1. Other fatigue We will obtain labs today. Glenda Crosby does feel that her weight is causing her energy to be lower than it should be. Fatigue may be related to obesity, depression or many other causes. Labs will be ordered, and in the meanwhile, Glenda Crosby will focus on self care including making healthy food choices, increasing physical activity and focusing on stress reduction.   - EKG 12-Lead - Hemoglobin A1c - Insulin, random - VITAMIN D 25 Hydroxy (Vit-D Deficiency, Fractures) - T4, free - T3  2. SOBOE (shortness of breath on exertion) Glenda Crosby does feel that  she gets out of breath more easily that she used to when she exercises. Glenda Crosby shortness of breath appears to be obesity related and exercise induced. She has agreed to work on weight loss and gradually increase exercise to treat her exercise induced shortness of breath. Will continue to monitor closely.   3. Other iron deficiency anemia Will follow up with labs in 2 months.  4. Elevated blood pressure reading without  diagnosis of hypertension Will follow up with blood pressure at next appointment.  5. Other hyperlipidemia Will follow up labs in Feb 2024.  6. Depression screening Glenda Crosby had a positive depression screening. Depression is commonly associated with obesity and often results in emotional eating behaviors. We will monitor this closely and work on CBT to help improve the non-hunger eating patterns. Referral to Psychology may be required if no improvement is seen as she continues in our clinic.   7. Obesity with a current BMI of 41.1 Glenda Crosby is currently in the action stage of change and her goal is to continue with weight loss efforts. I recommend Glenda Crosby begin the structured treatment plan as follows:  She has agreed to the Vegetarian Plan +300.  Exercise goals: No exercise has been prescribed at this time.   Behavioral modification strategies: increasing lean protein intake, meal planning and cooking strategies, keeping healthy foods in the home, and planning for success.  She was informed of the importance of frequent follow-up visits to maximize her success with intensive lifestyle modifications for her multiple health conditions. She was informed we would discuss her lab results at her next visit unless there is a critical issue that needs to be addressed sooner. Glenda Crosby agreed to keep her next visit at the agreed upon time to discuss these results.  Objective:   Blood pressure 136/88, pulse 74, temperature 98.6 F (37 C), height 5\' 4"  (1.626 m), weight 241 lb (109.3 kg), last menstrual period 09/21/2022, SpO2 100 %, unknown if currently breastfeeding. Body mass index is 41.37 kg/m.  EKG: Normal sinus rhythm, rate 78 bpm.  Indirect Calorimeter completed today shows a VO2 of 280 ml and a REE of 1930.  Her calculated basal metabolic rate is 11/21/2022 thus her basal metabolic rate is better than expected.  General: Cooperative, alert, well developed, in no acute distress. HEENT: Conjunctivae and  lids unremarkable. Cardiovascular: Regular rhythm.  Lungs: Normal work of breathing. Neurologic: No focal deficits.   Lab Results  Component Value Date   CREATININE 0.58 03/30/2015   BUN 7 03/30/2015   NA 136 03/30/2015   K 3.8 03/30/2015   CL 107 03/30/2015   CO2 21 03/30/2015   Lab Results  Component Value Date   ALT 10 03/30/2015   AST 19 03/30/2015   ALKPHOS 97 03/30/2015   BILITOT 0.5 03/30/2015   Lab Results  Component Value Date   HGBA1C 5.6 10/27/2022   Lab Results  Component Value Date   INSULIN 18.6 10/27/2022   No results found for: "TSH" No results found for: "CHOL", "HDL", "LDLCALC", "LDLDIRECT", "TRIG", "CHOLHDL" Lab Results  Component Value Date   WBC 10.3 11/19/2016   HGB 11.8 (L) 11/19/2016   HCT 35.8 (L) 11/19/2016   MCV 84.8 11/19/2016   PLT 227 11/19/2016   No results found for: "IRON", "TIBC", "FERRITIN"  Attestation Statements:   Reviewed by clinician on day of visit: allergies, medications, problem list, medical history, surgical history, family history, social history, and previous encounter notes.  Time spent on visit including pre-visit chart review  and post-visit charting and care was 45 minutes.   I, Fortino Sic, RMA am acting as transcriptionist for Reuben Likes, MD.  This is the patient's first visit at [facility]. The patient's NEW PATIENT PACKET was reviewed at length. Included in the packet: current and past health history, medications, allergies, ROS, gynecologic history (women only), surgical history, family history, social history, weight history, weight loss surgery history (for those that have had weight loss surgery), nutritional evaluation, mood and food questionnaire, PHQ9, Epworth questionnaire, sleep habits questionnaire, patient life and health improvement goals questionnaire. These will all be scanned into the patient's chart under media.   During the visit, I independently reviewed the patient's  EKG, bioimpedance scale results, and indirect calorimeter results. I used this information to tailor a meal plan for the patient that will help her to lose weight and will improve her obesity-related conditions going forward. I performed a medically necessary appropriate examination and/or evaluation. I discussed the assessment and treatment plan with the patient. The patient was provided an opportunity to ask questions and all were answered. The patient agreed with the plan and demonstrated an understanding of the instructions. Labs were ordered at this visit and will be reviewed at the next visit unless more critical results need to be addressed immediately. Clinical information was updated and documented in the EMR.    I have reviewed the above documentation for accuracy and completeness, and I agree with the above. - Reuben Likes, MD

## 2022-11-10 ENCOUNTER — Other Ambulatory Visit (HOSPITAL_BASED_OUTPATIENT_CLINIC_OR_DEPARTMENT_OTHER): Payer: Self-pay

## 2022-11-10 ENCOUNTER — Ambulatory Visit (INDEPENDENT_AMBULATORY_CARE_PROVIDER_SITE_OTHER): Payer: 59 | Admitting: Family Medicine

## 2022-11-10 ENCOUNTER — Encounter (INDEPENDENT_AMBULATORY_CARE_PROVIDER_SITE_OTHER): Payer: Self-pay | Admitting: Family Medicine

## 2022-11-10 VITALS — BP 131/75 | HR 68 | Temp 98.2°F | Ht 64.0 in | Wt 229.0 lb

## 2022-11-10 DIAGNOSIS — E7849 Other hyperlipidemia: Secondary | ICD-10-CM | POA: Diagnosis not present

## 2022-11-10 DIAGNOSIS — E559 Vitamin D deficiency, unspecified: Secondary | ICD-10-CM | POA: Diagnosis not present

## 2022-11-10 DIAGNOSIS — D508 Other iron deficiency anemias: Secondary | ICD-10-CM | POA: Diagnosis not present

## 2022-11-10 DIAGNOSIS — Z6839 Body mass index (BMI) 39.0-39.9, adult: Secondary | ICD-10-CM | POA: Diagnosis not present

## 2022-11-10 DIAGNOSIS — E88819 Insulin resistance, unspecified: Secondary | ICD-10-CM

## 2022-11-10 DIAGNOSIS — E669 Obesity, unspecified: Secondary | ICD-10-CM | POA: Diagnosis not present

## 2022-11-10 MED ORDER — VITAMIN D (ERGOCALCIFEROL) 1.25 MG (50000 UNIT) PO CAPS
50000.0000 [IU] | ORAL_CAPSULE | ORAL | 0 refills | Status: DC
  Filled 2022-11-10: qty 4, 28d supply, fill #0

## 2022-11-24 NOTE — Progress Notes (Signed)
Chief Complaint:   OBESITY Glenda Crosby is here to discuss her progress with her obesity treatment plan along with follow-up of her obesity related diagnoses. Glenda Crosby is on the Vegetarian Plan +300 and states she is following her eating plan approximately 95% of the time. Glenda Crosby states she is walking 2 miles 7 times per week.  Today's visit was #: 2 Starting weight: 241 lbs Starting date: 10/27/2022 Today's weight: 229 lbs Today's date: 11/10/2022 Total lbs lost to date: 12 lbs Total lbs lost since last in-office visit: 12  Interim History: Glenda Crosby did very well on meal plan---does not like pasta dish for super. Doing soup option at lunch consistently. No hunger. For snacks using calories for coffee (creamer). She is cooking for Thanksgiving. Does have some angst around food choices at Thanksgiving.  Subjective:   1. Vitamin D deficiency Labs discussed during visit today. Glenda Crosby not on Vit D. Glenda Crosby level of 25.3. She notes fatigue.  2. Insulin resistance Labs discussed during visit today. Glenda Crosby's A1c was 5.6, Insulin was 18.6.  3. Other iron deficiency anemia  Glenda Crosby is taking PNV now. MCV of 77, H/H of 11/34.4.  4. Other hyperlipidemia Glenda Crosby's LDL of 122, HDL of 51 and Trigly of 101. She is changing how she eats.  Assessment/Plan:   1. Vitamin D deficiency START Vit D 50K IU once a week for 1 month with 0 refills.  -Start Vitamin D, Ergocalciferol, (DRISDOL) 1.25 MG (50000 UNIT) CAPS capsule; Take 1 capsule (50,000 Units total) by mouth every 7 (seven) days.  Dispense: 4 capsule; Refill: 0  2. Insulin resistance Discussed at length pathphysiology of IR, Prediabetes, Diabetes. No medication at this time except meal plan.  3. Other iron deficiency anemia Follow up on repeated labs in January.  4. Other hyperlipidemia Will repeat cholesterol panel in 3 months.  5. Obesity with a current BMI of 39.4 Glenda Crosby is currently in the action stage of change. As such, her goal is to continue  with weight loss efforts. She has agreed to the Vegetarian Plan +300. Can use flatbread option for dinner if increased refried beans and crumbles to 3/4 cup each.  Exercise goals: As is.  Behavioral modification strategies: increasing lean protein intake, meal planning and cooking strategies, keeping healthy foods in the home, and planning for success.  Glenda Crosby has agreed to follow-up with our clinic in 3 weeks. She was informed of the importance of frequent follow-up visits to maximize her success with intensive lifestyle modifications for her multiple health conditions.   Objective:   Blood pressure 131/75, pulse 68, temperature 98.2 F (36.8 C), temperature source Oral, height 5\' 4"  (1.626 m), weight 229 lb (103.9 kg), last menstrual period 09/20/2022, SpO2 100 %, unknown if currently breastfeeding. Body mass index is 39.31 kg/m.  General: Cooperative, alert, well developed, in no acute distress. HEENT: Conjunctivae and lids unremarkable. Cardiovascular: Regular rhythm.  Lungs: Normal work of breathing. Neurologic: No focal deficits.   Lab Results  Component Value Date   CREATININE 0.58 03/30/2015   BUN 7 03/30/2015   NA 136 03/30/2015   K 3.8 03/30/2015   CL 107 03/30/2015   CO2 21 03/30/2015   Lab Results  Component Value Date   ALT 10 03/30/2015   AST 19 03/30/2015   ALKPHOS 97 03/30/2015   BILITOT 0.5 03/30/2015   Lab Results  Component Value Date   HGBA1C 5.6 10/27/2022   Lab Results  Component Value Date   INSULIN 18.6 10/27/2022   No  results found for: "TSH" No results found for: "CHOL", "HDL", "[place]", "LDLDIRECT", "TRIG", "CHOLHDL" Lab Results  Component Value Date   VD25OH 25.3 (L) 10/27/2022   Lab Results  Component Value Date   WBC 10.3 11/19/2016   HGB 11.8 (L) 11/19/2016   HCT 35.8 (L) 11/19/2016   MCV 84.8 11/19/2016   PLT 227 11/19/2016   No results found for: "IRON", "TIBC", "FERRITIN"  Attestation Statements:   Reviewed by clinician  on day of visit: allergies, medications, problem list, medical history, surgical history, family history, social history, and previous encounter notes.  Time spent on visit including pre-visit chart review and post-visit care and charting was 42 minutes.   I, Elnora Morrison, RMA am acting as transcriptionist for Coralie Common, MD.  I have reviewed the above documentation for accuracy and completeness, and I agree with the above. - Coralie Common, MD

## 2022-12-03 ENCOUNTER — Encounter (INDEPENDENT_AMBULATORY_CARE_PROVIDER_SITE_OTHER): Payer: Self-pay | Admitting: Family Medicine

## 2022-12-03 ENCOUNTER — Ambulatory Visit (INDEPENDENT_AMBULATORY_CARE_PROVIDER_SITE_OTHER): Payer: 59 | Admitting: Family Medicine

## 2022-12-03 ENCOUNTER — Other Ambulatory Visit (HOSPITAL_COMMUNITY): Payer: Self-pay

## 2022-12-03 VITALS — BP 138/80 | HR 74 | Temp 98.5°F | Ht 64.0 in | Wt 222.0 lb

## 2022-12-03 DIAGNOSIS — E559 Vitamin D deficiency, unspecified: Secondary | ICD-10-CM

## 2022-12-03 DIAGNOSIS — D649 Anemia, unspecified: Secondary | ICD-10-CM

## 2022-12-03 DIAGNOSIS — E669 Obesity, unspecified: Secondary | ICD-10-CM

## 2022-12-03 DIAGNOSIS — Z6838 Body mass index (BMI) 38.0-38.9, adult: Secondary | ICD-10-CM | POA: Diagnosis not present

## 2022-12-03 MED ORDER — VITAMIN D (ERGOCALCIFEROL) 1.25 MG (50000 UNIT) PO CAPS
50000.0000 [IU] | ORAL_CAPSULE | ORAL | 0 refills | Status: DC
  Filled 2022-12-03 – 2022-12-18 (×2): qty 4, 28d supply, fill #0

## 2022-12-15 ENCOUNTER — Other Ambulatory Visit (HOSPITAL_COMMUNITY): Payer: Self-pay

## 2022-12-16 NOTE — Progress Notes (Signed)
Chief Complaint:   OBESITY Glenda Crosby is here to discuss her progress with her obesity treatment plan along with follow-up of her obesity related diagnoses. Glenda Crosby is on the Vegetarian Plan and states she is following her eating plan approximately 70% of the time. Glenda Crosby states she is walking 2 miles 6 times per week.  Today's visit was #: 3 Starting weight: 241 lbs Starting date: 10/27/2022 Today's weight: 222 lbs Today's date: 12/03/2022 Total lbs lost to date: 19 lbs Total lbs lost since last in-office visit: 7  Interim History: Glenda Crosby has been mostly working and regular life.  Sometimes she misses a meal due to schedule and routine.  Had a small family gathering for Thanksgiving-likely the same for Christmas.  Will get breakfast in.  Subjective:   1. Vitamin D deficiency Glenda Crosby last Vit D level not at goal. She notes fatigue.  2. Anemia, unspecified type Glenda Crosby is on Prenatal vitamin. H/H low MCV within normal limits but lower end of normal.  Assessment/Plan:   1. Vitamin D deficiency We will refill Vit D 50K IU once a week for 1 month with 0 refills.  -Refill Vitamin D, Ergocalciferol, (DRISDOL) 1.25 MG (50000 UNIT) CAPS capsule; Take 1 capsule (50,000 Units total) by mouth every 7 (seven) days.  Dispense: 4 capsule; Refill: 0  2. Anemia, unspecified type Will obtain labs in February.  3. Obesity with a current BMI of 38.1 Glenda Crosby is currently in the action stage of change. As such, her goal is to continue with weight loss efforts. She has agreed to the Vegetarian Plan +300.   Exercise goals: All adults should avoid inactivity. Some physical activity is better than none, and adults who participate in any amount of physical activity gain some health benefits.  Behavioral modification strategies: increasing lean protein intake, meal planning and cooking strategies, keeping healthy foods in the home, and planning for success.  Glenda Crosby has agreed to follow-up with our clinic in 3  weeks. She was informed of the importance of frequent follow-up visits to maximize her success with intensive lifestyle modifications for her multiple health conditions.   Objective:   Blood pressure 138/80, pulse 74, temperature 98.5 F (36.9 C), height 5\' 4"  (1.626 m), weight 222 lb (100.7 kg), last menstrual period 09/20/2022, SpO2 98 %, unknown if currently breastfeeding. Body mass index is 38.11 kg/m.  General: Cooperative, alert, well developed, in no acute distress. HEENT: Conjunctivae and lids unremarkable. Cardiovascular: Regular rhythm.  Lungs: Normal work of breathing. Neurologic: No focal deficits.   Lab Results  Component Value Date   CREATININE 0.58 03/30/2015   BUN 7 03/30/2015   NA 136 03/30/2015   K 3.8 03/30/2015   CL 107 03/30/2015   CO2 21 03/30/2015   Lab Results  Component Value Date   ALT 10 03/30/2015   AST 19 03/30/2015   ALKPHOS 97 03/30/2015   BILITOT 0.5 03/30/2015   Lab Results  Component Value Date   HGBA1C 5.6 10/27/2022   Lab Results  Component Value Date   INSULIN 18.6 10/27/2022   No results found for: "TSH" No results found for: "CHOL", "HDL", "LDLCALC", "LDLDIRECT", "TRIG", "CHOLHDL" Lab Results  Component Value Date   VD25OH 25.3 (L) 10/27/2022   Lab Results  Component Value Date   WBC 10.3 11/19/2016   HGB 11.8 (L) 11/19/2016   HCT 35.8 (L) 11/19/2016   MCV 84.8 11/19/2016   PLT 227 11/19/2016   No results found for: "IRON", "TIBC", "FERRITIN"  Attestation Statements:  Reviewed by clinician on day of visit: allergies, medications, problem list, medical history, surgical history, family history, social history, and previous encounter notes.  I, Elnora Morrison, RMA am acting as transcriptionist for Coralie Common, MD.  I have reviewed the above documentation for accuracy and completeness, and I agree with the above. - Coralie Common, MD

## 2022-12-18 ENCOUNTER — Other Ambulatory Visit (HOSPITAL_COMMUNITY): Payer: Self-pay

## 2022-12-29 ENCOUNTER — Other Ambulatory Visit (HOSPITAL_BASED_OUTPATIENT_CLINIC_OR_DEPARTMENT_OTHER): Payer: Self-pay

## 2022-12-29 ENCOUNTER — Ambulatory Visit (INDEPENDENT_AMBULATORY_CARE_PROVIDER_SITE_OTHER): Payer: Commercial Managed Care - PPO | Admitting: Family Medicine

## 2022-12-29 ENCOUNTER — Encounter (INDEPENDENT_AMBULATORY_CARE_PROVIDER_SITE_OTHER): Payer: Self-pay | Admitting: Family Medicine

## 2022-12-29 VITALS — BP 148/77 | HR 64 | Temp 98.4°F | Ht 64.0 in | Wt 212.6 lb

## 2022-12-29 DIAGNOSIS — E559 Vitamin D deficiency, unspecified: Secondary | ICD-10-CM

## 2022-12-29 DIAGNOSIS — D508 Other iron deficiency anemias: Secondary | ICD-10-CM | POA: Diagnosis not present

## 2022-12-29 DIAGNOSIS — Z6836 Body mass index (BMI) 36.0-36.9, adult: Secondary | ICD-10-CM

## 2022-12-29 DIAGNOSIS — E669 Obesity, unspecified: Secondary | ICD-10-CM | POA: Diagnosis not present

## 2022-12-29 MED ORDER — VITAMIN D (ERGOCALCIFEROL) 1.25 MG (50000 UNIT) PO CAPS
50000.0000 [IU] | ORAL_CAPSULE | ORAL | 0 refills | Status: DC
  Filled 2022-12-29: qty 4, 28d supply, fill #0

## 2023-01-05 NOTE — Progress Notes (Signed)
Chief Complaint:   OBESITY Glenda Crosby is here to discuss her progress with her obesity treatment plan along with follow-up of her obesity related diagnoses. Glenda Crosby is on the [place] +300 and states she is following her eating plan approximately 85% of the time. Glenda Crosby states she is walking 2 miles 5-6 times per week.  Today's visit was #: 4 Starting weight: 241 lbs Starting date: 10/27/2022 Today's weight: 212 lbs Today's date: 12/29/2022 Total lbs lost to date: 29 lbs Total lbs lost since last in-office visit: 10 lbs  Interim History: Glenda Crosby had a nice low key December holiday.  Someday's she still not getting all food in during the day.  Wants to incorporate other soup options/[employer] black bean soup 100 cal/1 cup with 9 grams of protein.   Subjective:   1. Vitamin D deficiency Glenda Crosby is currently taking prescription Vit D 50,000 IU once a week.  Denies any nausea, vomiting or muscle weakness.  She notes fatigue.   2. Other iron deficiency anemia Last MCV of 77, Iron was within normal limits but ferritin decreased.  Assessment/Plan:   1. Vitamin D deficiency We will refill Vit D 50K IU once a week for 1 month with 0 refills.  -Refill Vitamin D, Ergocalciferol, (DRISDOL) 1.25 MG (50000 UNIT) CAPS capsule; Take 1 capsule (50,000 Units total) by mouth every 7 (seven) days.  Dispense: 4 capsule; Refill: 0  2. Other iron deficiency anemia Repeat labs in 1 month.  3. Obesity with a current BMI of 36.5 Glenda Crosby is currently in the action stage of change. As such, her goal is to continue with weight loss efforts. She has agreed to the [place] +300 with other soup options.   Exercise goals: All adults should avoid inactivity. Some physical activity is better than none, and adults who participate in any amount of physical activity gain some health benefits.  Behavioral modification strategies: increasing lean protein intake, meal planning and cooking strategies, keeping  healthy foods in the home, and planning for success.  Glenda Crosby has agreed to follow-up with our clinic in 4 weeks. She was informed of the importance of frequent follow-up visits to maximize her success with intensive lifestyle modifications for her multiple health conditions.   Objective:   Blood pressure (!) 148/77, pulse 64, temperature 98.4 F (36.9 C), height 5\' 4"  (1.626 m), weight 212 lb 9.6 oz (96.4 kg), SpO2 100 %, unknown if currently breastfeeding. Body mass index is 36.49 kg/m.  General: Cooperative, alert, well developed, in no acute distress. HEENT: Conjunctivae and lids unremarkable. Cardiovascular: Regular rhythm.  Lungs: Normal work of breathing. Neurologic: No focal deficits.   Lab Results  Component Value Date   CREATININE 0.58 03/30/2015   BUN 7 03/30/2015   NA 136 03/30/2015   K 3.8 03/30/2015   CL 107 03/30/2015   CO2 21 03/30/2015   Lab Results  Component Value Date   ALT 10 03/30/2015   AST 19 03/30/2015   ALKPHOS 97 03/30/2015   BILITOT 0.5 03/30/2015   Lab Results  Component Value Date   HGBA1C 5.6 10/27/2022   Lab Results  Component Value Date   INSULIN 18.6 10/27/2022   No results found for: "TSH" No results found for: "CHOL", "HDL", "LDLCALC", "LDLDIRECT", "TRIG", "CHOLHDL" Lab Results  Component Value Date   VD25OH 25.3 (L) 10/27/2022   Lab Results  Component Value Date   WBC 10.3 11/19/2016   HGB 11.8 (L) 11/19/2016   HCT 35.8 (L) 11/19/2016  MCV 84.8 11/19/2016   PLT 227 11/19/2016   No results found for: "IRON", "TIBC", "FERRITIN"  Attestation Statements:   Reviewed by clinician on day of visit: allergies, medications, problem list, medical history, surgical history, family history, social history, and previous encounter notes.  I, Elnora Morrison, RMA am acting as transcriptionist for Coralie Common, MD.  I have reviewed the above documentation for accuracy and completeness, and I agree with the above. - Coralie Common, MD

## 2023-01-21 DIAGNOSIS — H5203 Hypermetropia, bilateral: Secondary | ICD-10-CM | POA: Diagnosis not present

## 2023-01-28 ENCOUNTER — Other Ambulatory Visit (HOSPITAL_BASED_OUTPATIENT_CLINIC_OR_DEPARTMENT_OTHER): Payer: Self-pay

## 2023-01-28 ENCOUNTER — Ambulatory Visit (INDEPENDENT_AMBULATORY_CARE_PROVIDER_SITE_OTHER): Payer: Commercial Managed Care - PPO | Admitting: Family Medicine

## 2023-01-28 ENCOUNTER — Encounter (INDEPENDENT_AMBULATORY_CARE_PROVIDER_SITE_OTHER): Payer: Self-pay | Admitting: Family Medicine

## 2023-01-28 VITALS — BP 113/69 | HR 64 | Temp 98.6°F | Ht 64.0 in | Wt 202.0 lb

## 2023-01-28 DIAGNOSIS — Z6834 Body mass index (BMI) 34.0-34.9, adult: Secondary | ICD-10-CM

## 2023-01-28 DIAGNOSIS — E559 Vitamin D deficiency, unspecified: Secondary | ICD-10-CM | POA: Diagnosis not present

## 2023-01-28 DIAGNOSIS — D508 Other iron deficiency anemias: Secondary | ICD-10-CM | POA: Diagnosis not present

## 2023-01-28 DIAGNOSIS — E7849 Other hyperlipidemia: Secondary | ICD-10-CM | POA: Diagnosis not present

## 2023-01-28 DIAGNOSIS — E88819 Insulin resistance, unspecified: Secondary | ICD-10-CM | POA: Diagnosis not present

## 2023-01-28 DIAGNOSIS — E669 Obesity, unspecified: Secondary | ICD-10-CM

## 2023-01-28 MED ORDER — VITAMIN D (ERGOCALCIFEROL) 1.25 MG (50000 UNIT) PO CAPS
50000.0000 [IU] | ORAL_CAPSULE | ORAL | 0 refills | Status: DC
  Filled 2023-01-28: qty 4, 28d supply, fill #0

## 2023-01-28 NOTE — Progress Notes (Deleted)
Patient has been mostly up to the same- work, life, kids.  Work has somewhat slowed down.  Doing well on vegetarian plan and not getting bored with monotony.  Next few weeks not much coming up for herself. No anticipated obstacles in the next few weeks.  She is doing pilates and walking and it is sustainable for her at this time.

## 2023-01-29 LAB — COMPREHENSIVE METABOLIC PANEL
ALT: 11 IU/L (ref 0–32)
AST: 14 IU/L (ref 0–40)
Albumin/Globulin Ratio: 2 (ref 1.2–2.2)
Albumin: 4.4 g/dL (ref 3.9–4.9)
Alkaline Phosphatase: 50 IU/L (ref 44–121)
BUN/Creatinine Ratio: 17 (ref 9–23)
BUN: 13 mg/dL (ref 6–20)
Bilirubin Total: 0.3 mg/dL (ref 0.0–1.2)
CO2: 23 mmol/L (ref 20–29)
Calcium: 9.3 mg/dL (ref 8.7–10.2)
Chloride: 104 mmol/L (ref 96–106)
Creatinine, Ser: 0.75 mg/dL (ref 0.57–1.00)
Globulin, Total: 2.2 g/dL (ref 1.5–4.5)
Glucose: 86 mg/dL (ref 70–99)
Potassium: 4.8 mmol/L (ref 3.5–5.2)
Sodium: 142 mmol/L (ref 134–144)
Total Protein: 6.6 g/dL (ref 6.0–8.5)
eGFR: 104 mL/min/{1.73_m2} (ref >59)

## 2023-01-29 LAB — CBC WITH DIFFERENTIAL/PLATELET
Basophils Absolute: 0 10*3/uL (ref 0.0–0.2)
Basos: 1 %
EOS (ABSOLUTE): 0.2 10*3/uL (ref 0.0–0.4)
Eos: 5 %
Hemoglobin: 13.2 g/dL (ref 11.1–15.9)
Immature Grans (Abs): 0 10*3/uL (ref 0.0–0.1)
Immature Granulocytes: 0 %
Lymphocytes Absolute: 1.1 10*3/uL (ref 0.7–3.1)
Lymphs: 26 %
MCH: 28 pg (ref 26.6–33.0)
MCHC: 32.1 g/dL (ref 31.5–35.7)
MCV: 87 fL (ref 79–97)
Monocytes Absolute: 0.3 10*3/uL (ref 0.1–0.9)
Monocytes: 7 %
Neutrophils Absolute: 2.6 10*3/uL (ref 1.4–7.0)
Neutrophils: 61 %
Platelets: 216 10*3/uL (ref 150–450)
RBC: 4.71 x10E6/uL (ref 3.77–5.28)
RDW: 16.7 % — ABNORMAL HIGH (ref 11.7–15.4)
WBC: 4.1 10*3/uL (ref 3.4–10.8)

## 2023-01-29 LAB — ANEMIA PANEL
Ferritin: 23 ng/mL (ref 15–150)
Folate, Hemolysate: 394 ng/mL
Folate, RBC: 959 ng/mL (ref >498)
Hematocrit: 41.1 % (ref 34.0–46.6)
Iron Saturation: 17 % (ref 15–55)
Iron: 49 ug/dL (ref 27–159)
Retic Ct Pct: 1 % (ref 0.6–2.6)
Total Iron Binding Capacity: 294 ug/dL (ref 250–450)
UIBC: 245 ug/dL (ref 131–425)
Vitamin B-12: 553 pg/mL (ref 232–1245)

## 2023-01-29 LAB — INSULIN, RANDOM: INSULIN: 8.1 u[IU]/mL (ref 2.6–24.9)

## 2023-01-29 LAB — LIPID PANEL WITH LDL/HDL RATIO
Cholesterol, Total: 145 mg/dL (ref 100–199)
HDL: 45 mg/dL (ref >39)
LDL Chol Calc (NIH): 88 mg/dL (ref 0–99)
LDL/HDL Ratio: 2 ratio (ref 0.0–3.2)
Triglycerides: 59 mg/dL (ref 0–149)
VLDL Cholesterol Cal: 12 mg/dL (ref 5–40)

## 2023-01-29 LAB — VITAMIN D 25 HYDROXY (VIT D DEFICIENCY, FRACTURES): Vit D, 25-Hydroxy: 44.6 ng/mL (ref 30.0–100.0)

## 2023-01-29 LAB — HEMOGLOBIN A1C
Est. average glucose Bld gHb Est-mCnc: 103 mg/dL
Hgb A1c MFr Bld: 5.2 % (ref 4.8–5.6)

## 2023-02-11 NOTE — Progress Notes (Signed)
Chief Complaint:   OBESITY Glenda Crosby is here to discuss her progress with her obesity treatment plan along with follow-up of her obesity related diagnoses. Glenda Crosby is on the [place] +300 and states she is following her eating plan approximately 90% of the time. Glenda Crosby states she is doing Pilates/walking 2 miles 30 minutes 7 times per week.  Today's visit was #: 5 Starting weight: 241 lbs Starting date: 10/27/2022 Today's weight: 202 lbs Today's date: 01/28/2023 Total lbs lost to date: 39 lbs Total lbs lost since last in-office visit: 10  Interim History: Glenda Crosby has been mostly up to the same- work, life, kids.  Work has somewhat slowed down.  Doing well on vegetarian plan and not getting bored with monotony.  Next few weeks not much coming up for herself. No anticipated obstacles in the next few weeks.  She is doing pilates and walking and it is sustainable for her at this time.  Subjective:   1. Other iron deficiency anemia Glenda Crosby is on PNV daily.  Last MCV less than 80.  2. Other hyperlipidemia Glenda Crosby's last LDL greater than 100.  She is not on medications.  3. Vitamin D deficiency Notes fatigue.  On prescription vitamin D-needs refill.  4. Insulin resistance Last A1c was within normal limits but insulin increased.  Not on medications.  Assessment/Plan:   1. Other iron deficiency anemia We will obtain labs today.  - Anemia panel - CBC w/Diff/Platelet  2. Other hyperlipidemia We will obtain labs today.  - Lipid Panel With LDL/HDL Ratio  3. Vitamin D deficiency We will obtain labs today.  We will refill Vit D 50K IU once a week for 1 month with 0 refills.  - VITAMIN D 25 Hydroxy (Vit-D Deficiency, Fractures)  -Refill Vitamin D, Ergocalciferol, (DRISDOL) 1.25 MG (50000 UNIT) CAPS capsule; Take 1 capsule (50,000 Units total) by mouth every 7 (seven) days.  Dispense: 4 capsule; Refill: 0  4. Insulin resistance We will obtain labs today.  - Comprehensive metabolic  panel - Hemoglobin A1c - Insulin, random  5. BMI 34.0-34.9,adult  6. Obesity with starting BMI of 41.4 Glenda Crosby is currently in the action stage of change. As such, her goal is to continue with weight loss efforts. She has agreed to the [place] +300.   Exercise goals: As is.  Discussed at length time under tension and muscle engagement.  Behavioral modification strategies: increasing lean protein intake, meal planning and cooking strategies, keeping healthy foods in the home, and planning for success.  Glenda Crosby has agreed to follow-up with our clinic in 4 weeks. She was informed of the importance of frequent follow-up visits to maximize her success with intensive lifestyle modifications for her multiple health conditions.   Glenda Crosby was informed we would discuss her lab results at her next visit unless there is a critical issue that needs to be addressed sooner. Glenda Crosby agreed to keep her next visit at the agreed upon time to discuss these results.  Objective:   Blood pressure 113/69, pulse 64, temperature 98.6 F (37 C), height '5\' 4"'$  (1.626 m), weight 202 lb (91.6 kg), SpO2 100 %, unknown if currently breastfeeding. Body mass index is 34.67 kg/m.  General: Cooperative, alert, well developed, in no acute distress. HEENT: Conjunctivae and lids unremarkable. Cardiovascular: Regular rhythm.  Lungs: Normal work of breathing. Neurologic: No focal deficits.   Lab Results  Component Value Date   CREATININE 0.75 01/28/2023   BUN 13 01/28/2023   NA 142 01/28/2023  K 4.8 01/28/2023   CL 104 01/28/2023   CO2 23 01/28/2023   Lab Results  Component Value Date   ALT 11 01/28/2023   AST 14 01/28/2023   ALKPHOS 50 01/28/2023   BILITOT 0.3 01/28/2023   Lab Results  Component Value Date   HGBA1C 5.2 01/28/2023   HGBA1C 5.6 10/27/2022   Lab Results  Component Value Date   INSULIN 8.1 01/28/2023   INSULIN 18.6 10/27/2022   No results found for: "TSH" Lab Results  Component Value  Date   CHOL 145 01/28/2023   HDL 45 01/28/2023   LDLCALC 88 01/28/2023   TRIG 59 01/28/2023   Lab Results  Component Value Date   VD25OH 44.6 01/28/2023   VD25OH 25.3 (L) 10/27/2022   Lab Results  Component Value Date   WBC 4.1 01/28/2023   HGB 13.2 01/28/2023   HCT 41.1 01/28/2023   MCV 87 01/28/2023   PLT 216 01/28/2023   Lab Results  Component Value Date   IRON 49 01/28/2023   TIBC 294 01/28/2023   FERRITIN 23 01/28/2023   Attestation Statements:   Reviewed by clinician on day of visit: allergies, medications, problem list, medical history, surgical history, family history, social history, and previous encounter notes.  I, Elnora Morrison, RMA am acting as transcriptionist for Coralie Common, MD.  I have reviewed the above documentation for accuracy and completeness, and I agree with the above. - Coralie Common, MD

## 2023-02-25 ENCOUNTER — Ambulatory Visit (INDEPENDENT_AMBULATORY_CARE_PROVIDER_SITE_OTHER): Payer: Commercial Managed Care - PPO | Admitting: Family Medicine

## 2023-02-25 ENCOUNTER — Encounter (INDEPENDENT_AMBULATORY_CARE_PROVIDER_SITE_OTHER): Payer: Self-pay | Admitting: Family Medicine

## 2023-02-25 ENCOUNTER — Other Ambulatory Visit (HOSPITAL_BASED_OUTPATIENT_CLINIC_OR_DEPARTMENT_OTHER): Payer: Self-pay

## 2023-02-25 VITALS — BP 111/70 | HR 62 | Temp 98.2°F | Ht 64.0 in | Wt 194.0 lb

## 2023-02-25 DIAGNOSIS — Z6833 Body mass index (BMI) 33.0-33.9, adult: Secondary | ICD-10-CM

## 2023-02-25 DIAGNOSIS — E669 Obesity, unspecified: Secondary | ICD-10-CM | POA: Diagnosis not present

## 2023-02-25 DIAGNOSIS — E88819 Insulin resistance, unspecified: Secondary | ICD-10-CM | POA: Diagnosis not present

## 2023-02-25 DIAGNOSIS — E559 Vitamin D deficiency, unspecified: Secondary | ICD-10-CM

## 2023-02-25 MED ORDER — VITAMIN D (ERGOCALCIFEROL) 1.25 MG (50000 UNIT) PO CAPS
50000.0000 [IU] | ORAL_CAPSULE | ORAL | 0 refills | Status: DC
  Filled 2023-02-25 – 2023-03-08 (×2): qty 4, 28d supply, fill #0

## 2023-02-25 NOTE — Progress Notes (Signed)
INFORMATION SESSION Chief Complaint:   Today's visit was #: Information session Today's weight: 239 lbs Today's date:10/01/2022  Interim History: Patient works in pediatric ED.  Normally works 3-12-hour shifts, 2-6-hour shifts or 2-8-hour shifts.  Previously worked night shift for 16 years.  Now will likely work 7 AM to 5 PM.  4 children oldest is 61 youngest is 5.  Vegetarian with some dairy cheese and [place] yogurt.  Overnight oats, half cup oats, 1 tablespoon Chia seed, almond milk, cranberry or raisins.  Rice cakes as a snack.  Dinner is fast food, takeout like macaroni and cheese or fried.  Occasionally uses vegetarian products.  Refried beans, sour cream, olives.  Subjective:   1. Elevated blood pressure reading Patient has no history of hypertension.  Blood pressure elevated today.  Patient denies chest pain, chest pressure or headache.  2. Other hyperlipidemia LDL 122 last week.  3. Other iron deficiency anemia Patient is not on any multivitamin but had a previous diagnosis of iron deficiency anemia.  Assessment/Plan:   1. Elevated blood pressure reading Follow-up with blood pressure at next appointment.  2. Other hyperlipidemia Order FLP to follow up on diagnosis at next appointment/ new patient appointment.  3. Other iron deficiency anemia Will order CBC and Anemia panel at next appointment.  4. Class 3 severe obesity with serious comorbidity and body mass index (BMI) of 40.0 to 44.9 in adult, unspecified obesity type (HCC) Behavioral modification strategies: increasing lean protein intake, meal planning and cooking strategies, keeping healthy foods in the home, and planning for success.  Objective:   Blood pressure (!) 144/79, pulse 77, temperature 98 F (36.7 C), height  (1.626 m), weight 239 lb (108.4 kg), last menstrual period 09/21/2022, SpO2 100 %, unknown if currently breastfeeding. Body mass index is 41.02 kg/m.  General: Cooperative, alert, well  developed, in no acute distress. HEENT: Conjunctivae and lids unremarkable. Cardiovascular: Regular rhythm.  Lungs: Normal work of breathing. Neurologic: No focal deficits.   Lab Results  Component Value Date   CREATININE 0.75 01/28/2023   BUN 13 01/28/2023   NA 142 01/28/2023   K 4.8 01/28/2023   CL 104 01/28/2023   CO2 23 01/28/2023   Lab Results  Component Value Date   ALT 11 01/28/2023   AST 14 01/28/2023   ALKPHOS 50 01/28/2023   BILITOT 0.3 01/28/2023   Lab Results  Component Value Date   HGBA1C 5.2 01/28/2023   HGBA1C 5.6 10/27/2022   Lab Results  Component Value Date   INSULIN 8.1 01/28/2023   INSULIN 18.6 10/27/2022   No results found for: "TSH" Lab Results  Component Value Date   CHOL 145 01/28/2023   HDL 45 01/28/2023   LDLCALC 88 01/28/2023   TRIG 59 01/28/2023   Lab Results  Component Value Date   VD25OH 44.6 01/28/2023   VD25OH 25.3 (L) 10/27/2022   Lab Results  Component Value Date   WBC 4.1 01/28/2023   HGB 13.2 01/28/2023   HCT 41.1 01/28/2023   MCV 87 01/28/2023   PLT 216 01/28/2023   Lab Results  Component Value Date   IRON 49 01/28/2023   TIBC 294 01/28/2023   FERRITIN 23 01/28/2023   Attestation Statements:   Reviewed by clinician on day of visit: allergies, medications, problem list, medical history, surgical history, family history, social history, and previous encounter notes. Total time spent on pre visit chart review and post visit chart completion was 40 minutes. I, Malcolm Metro, RMA,  am acting as transcriptionist for Coralie Common, MD.  I have reviewed the above documentation for accuracy and completeness, and I agree with the above. - Coralie Common, MD

## 2023-02-25 NOTE — Progress Notes (Deleted)
Patient has been doing mostly the same since last appointment. Vegetarian plan has been going really well.  Has been a bit more hungry but is not sure if that is thirst related.  She has started some exercise recently.  Has started [employer] which is a combination of HIIT/ aerobics/ cardio.  Hunger is most prevalent when she hasn't been able to get all the food in normal time frame.  No plans coming up.  Staying local for spring break.

## 2023-03-04 ENCOUNTER — Other Ambulatory Visit (HOSPITAL_BASED_OUTPATIENT_CLINIC_OR_DEPARTMENT_OTHER): Payer: Self-pay

## 2023-03-08 ENCOUNTER — Other Ambulatory Visit (HOSPITAL_BASED_OUTPATIENT_CLINIC_OR_DEPARTMENT_OTHER): Payer: Self-pay

## 2023-03-08 NOTE — Progress Notes (Signed)
Chief Complaint:   OBESITY Glenda Crosby is here to discuss her progress with her obesity treatment plan along with follow-up of her obesity related diagnoses. Glenda Crosby is on the [place] of 300 cal and states she is following her eating plan approximately 85-90% of the time. Glenda Crosby states she is exercise, walking 2 miles for 45 minutes 7 times per week.  Today's visit was #: 6 Starting weight:  84 LBS Starting date: 10/01/2022 Today's weight: 194 LBS Today's date: 02/25/2023 Total lbs lost to date: 75 LBS Total lbs lost since last in-office visit: 8 LBS  Interim History: Patient has been doing mostly the same since last appointment. Vegetarian plan has been going really well. Has been a bit more hungry but is not sure if that is thirst related. She has started some exercise recently. Has started [employer] which is a combination of HIIT/ aerobics/ cardio. Hunger is most prevalent when she hasn't been able to get all the food in normal time frame. No plans coming up. Staying local for spring break.   Subjective:   1. Vitamin D deficiency Patient is on prescription vitamin D.  Patient denies nausea, vomiting, muscle weakness, but is positive for fatigue.  2. Insulin resistance A1c significantly improved from 5.6 to 5.2.  Insulin level 18.6 to 8.1.  Patient is not on any medication.  Significant improvement since last labs.  Assessment/Plan:   1. Vitamin D deficiency Refill- Vitamin D, Ergocalciferol, (DRISDOL) 1.25 MG (50000 UNIT) CAPS capsule; Take 1 capsule (50,000 Units total) by mouth every 7 (seven) days.  Dispense: 4 capsule; Refill: 0  2. Insulin resistance Check labs in 4 to 5 months.  3. BMI 33.0-33.9,adult  4. Obesity with starting BMI of 41.4 Glenda Crosby is currently in the action stage of change. As such, her goal is to continue with weight loss efforts. She has agreed to the [place] with 300 cal..   Exercise goals:  As is.  Behavioral modification strategies:  increasing lean protein intake, meal planning and cooking strategies, keeping healthy foods in the home, and planning for success.  Glenda Crosby has agreed to follow-up with our clinic in 6 weeks. She was informed of the importance of frequent follow-up visits to maximize her success with intensive lifestyle modifications for her multiple health conditions.   Objective:   Blood pressure 111/70, pulse 62, temperature 98.2 F (36.8 C), height 5\' 4"  (1.626 m), weight 194 lb (88 kg), last menstrual period 02/07/2023, SpO2 100 %, unknown if currently breastfeeding. Body mass index is 33.3 kg/m.  General: Cooperative, alert, well developed, in no acute distress. HEENT: Conjunctivae and lids unremarkable. Cardiovascular: Regular rhythm.  Lungs: Normal work of breathing. Neurologic: No focal deficits.   Lab Results  Component Value Date   CREATININE 0.75 01/28/2023   BUN 13 01/28/2023   NA 142 01/28/2023   K 4.8 01/28/2023   CL 104 01/28/2023   CO2 23 01/28/2023   Lab Results  Component Value Date   ALT 11 01/28/2023   AST 14 01/28/2023   ALKPHOS 50 01/28/2023   BILITOT 0.3 01/28/2023   Lab Results  Component Value Date   HGBA1C 5.2 01/28/2023   HGBA1C 5.6 10/27/2022   Lab Results  Component Value Date   INSULIN 8.1 01/28/2023   INSULIN 18.6 10/27/2022   No results found for: "TSH" Lab Results  Component Value Date   CHOL 145 01/28/2023   HDL 45 01/28/2023   LDLCALC 88 01/28/2023   TRIG 59 01/28/2023  Lab Results  Component Value Date   VD25OH 44.6 01/28/2023   VD25OH 25.3 (L) 10/27/2022   Lab Results  Component Value Date   WBC 4.1 01/28/2023   HGB 13.2 01/28/2023   HCT 41.1 01/28/2023   MCV 87 01/28/2023   PLT 216 01/28/2023   Lab Results  Component Value Date   IRON 49 01/28/2023   TIBC 294 01/28/2023   FERRITIN 23 01/28/2023   Attestation Statements:   Reviewed by clinician on day of visit: allergies, medications, problem list, medical history, surgical  history, family history, social history, and previous encounter notes.  I, Davy Pique, RMA, am acting as transcriptionist for Coralie Common, MD.  I have reviewed the above documentation for accuracy and completeness, and I agree with the above. - Coralie Common, MD

## 2023-03-18 ENCOUNTER — Other Ambulatory Visit (HOSPITAL_BASED_OUTPATIENT_CLINIC_OR_DEPARTMENT_OTHER): Payer: Self-pay

## 2023-04-08 ENCOUNTER — Encounter (INDEPENDENT_AMBULATORY_CARE_PROVIDER_SITE_OTHER): Payer: Self-pay | Admitting: Family Medicine

## 2023-04-08 ENCOUNTER — Ambulatory Visit (INDEPENDENT_AMBULATORY_CARE_PROVIDER_SITE_OTHER): Payer: Commercial Managed Care - PPO | Admitting: Family Medicine

## 2023-04-08 ENCOUNTER — Other Ambulatory Visit (HOSPITAL_BASED_OUTPATIENT_CLINIC_OR_DEPARTMENT_OTHER): Payer: Self-pay

## 2023-04-08 VITALS — BP 121/67 | HR 62 | Temp 98.3°F | Ht 64.0 in | Wt 185.0 lb

## 2023-04-08 DIAGNOSIS — Z6831 Body mass index (BMI) 31.0-31.9, adult: Secondary | ICD-10-CM | POA: Diagnosis not present

## 2023-04-08 DIAGNOSIS — E559 Vitamin D deficiency, unspecified: Secondary | ICD-10-CM

## 2023-04-08 DIAGNOSIS — E88819 Insulin resistance, unspecified: Secondary | ICD-10-CM | POA: Diagnosis not present

## 2023-04-08 DIAGNOSIS — E669 Obesity, unspecified: Secondary | ICD-10-CM

## 2023-04-08 MED ORDER — VITAMIN D (ERGOCALCIFEROL) 1.25 MG (50000 UNIT) PO CAPS
50000.0000 [IU] | ORAL_CAPSULE | ORAL | 0 refills | Status: DC
  Filled 2023-04-08: qty 4, 28d supply, fill #0

## 2023-04-08 NOTE — Progress Notes (Signed)
Chief Complaint:   OBESITY Glenda Crosby is here to discuss her progress with her obesity treatment plan along with follow-up of her obesity related diagnoses. Glenda Crosby is on the Vegetarian Plan +300-calorie and states she is following her eating plan approximately 90% of the time. Glenda Crosby states she is exercising 55 minutes 7 times per week.  Today's visit was #: 7 Starting weight: 241 LBS Starting date: 10/27/2022 Today's weight: 185 LBS Today's date: 04/08/2023 Total lbs lost to date: 56 LBS Total lbs lost since last in-office visit: 9 LBS  Interim History: Since last appointment she has been doing a lot of soccer season.  She is starting to feel like the meal plan is habit.  The times she struggles is when she eats off plan due to schedule.  She will try to made more informed choices when eating out.  She has tried to be more consistent with her exercising as well.  She is planning on [employer] camp in [place] for her kids.    Subjective:   1. Vitamin D deficiency Patient is on prescription Vitamin D.  Last vitamin D level was 44.6.  2. Insulin resistance Patient has much better carb intake control.  Assessment/Plan:   1. Vitamin D deficiency Refill- Vitamin D, Ergocalciferol, (DRISDOL) 1.25 MG (50000 UNIT) CAPS capsule; Take 1 capsule (50,000 Units total) by mouth every 7 (seven) days.  Dispense: 4 capsule; Refill: 0  2. Insulin resistance Continue current meal plan.  3. BMI 31.0-31.9,adult  4. Obesity with starting BMI of 41.4 Glenda Crosby is currently in the action stage of change. As such, her goal is to continue with weight loss efforts. She has agreed to the Vegetarian Plan +300-calorie.[place] Kitchen   Exercise goals: All adults should avoid inactivity. Some physical activity is better than none, and adults who participate in any amount of physical activity gain some health benefits.  Behavioral modification strategies: increasing lean protein intake, meal planning and cooking strategies,  keeping healthy foods in the home, and planning for success.  Glenda Crosby has agreed to follow-up with our clinic in 5-6 weeks. She was informed of the importance of frequent follow-up visits to maximize her success with intensive lifestyle modifications for her multiple health conditions.   Objective:   Blood pressure 121/67, pulse 62, temperature 98.3 F (36.8 C), height  (1.626 m), weight 185 lb (83.9 kg), unknown if currently breastfeeding. Body mass index is 31.76 kg/m.  General: Cooperative, alert, well developed, in no acute distress. HEENT: Conjunctivae and lids unremarkable. Cardiovascular: Regular rhythm.  Lungs: Normal work of breathing. Neurologic: No focal deficits.   Lab Results  Component Value Date   CREATININE 0.75 01/28/2023   BUN 13 01/28/2023   NA 142 01/28/2023   K 4.8 01/28/2023   CL 104 01/28/2023   CO2 23 01/28/2023   Lab Results  Component Value Date   ALT 11 01/28/2023   AST 14 01/28/2023   ALKPHOS 50 01/28/2023   BILITOT 0.3 01/28/2023   Lab Results  Component Value Date   HGBA1C 5.2 01/28/2023   HGBA1C 5.6 10/27/2022   Lab Results  Component Value Date   INSULIN 8.1 01/28/2023   INSULIN 18.6 10/27/2022   No results found for: "TSH" Lab Results  Component Value Date   CHOL 145 01/28/2023   HDL 45 01/28/2023   LDLCALC 88 01/28/2023   TRIG 59 01/28/2023   Lab Results  Component Value Date   VD25OH 44.6 01/28/2023   VD25OH 25.3 (L) 10/27/2022  Lab Results  Component Value Date   WBC 4.1 01/28/2023   HGB 13.2 01/28/2023   HCT 41.1 01/28/2023   MCV 87 01/28/2023   PLT 216 01/28/2023   Lab Results  Component Value Date   IRON 49 01/28/2023   TIBC 294 01/28/2023   FERRITIN 23 01/28/2023   Attestation Statements:   Reviewed by clinician on day of visit: allergies, medications, problem list, medical history, surgical history, family history, social history, and previous encounter notes.  I, Malcolm Metro, RMA, am acting as  transcriptionist for Reuben Likes, MD.  I have reviewed the above documentation for accuracy and completeness, and I agree with the above. - Reuben Likes, MD

## 2023-04-26 DIAGNOSIS — Z124 Encounter for screening for malignant neoplasm of cervix: Secondary | ICD-10-CM | POA: Diagnosis not present

## 2023-04-26 DIAGNOSIS — Z01419 Encounter for gynecological examination (general) (routine) without abnormal findings: Secondary | ICD-10-CM | POA: Diagnosis not present

## 2023-05-13 ENCOUNTER — Ambulatory Visit (INDEPENDENT_AMBULATORY_CARE_PROVIDER_SITE_OTHER): Payer: Commercial Managed Care - PPO | Admitting: Family Medicine

## 2023-05-13 ENCOUNTER — Other Ambulatory Visit (HOSPITAL_BASED_OUTPATIENT_CLINIC_OR_DEPARTMENT_OTHER): Payer: Self-pay

## 2023-05-13 VITALS — BP 117/69 | HR 63 | Temp 98.3°F | Ht 64.0 in | Wt 178.0 lb

## 2023-05-13 DIAGNOSIS — E669 Obesity, unspecified: Secondary | ICD-10-CM | POA: Diagnosis not present

## 2023-05-13 DIAGNOSIS — E7849 Other hyperlipidemia: Secondary | ICD-10-CM

## 2023-05-13 DIAGNOSIS — Z6830 Body mass index (BMI) 30.0-30.9, adult: Secondary | ICD-10-CM

## 2023-05-13 DIAGNOSIS — E559 Vitamin D deficiency, unspecified: Secondary | ICD-10-CM | POA: Diagnosis not present

## 2023-05-13 MED ORDER — VITAMIN D (ERGOCALCIFEROL) 1.25 MG (50000 UNIT) PO CAPS
50000.0000 [IU] | ORAL_CAPSULE | ORAL | 0 refills | Status: DC
  Filled 2023-05-13 – 2023-06-17 (×2): qty 8, 56d supply, fill #0

## 2023-05-13 NOTE — Progress Notes (Signed)
Chief Complaint:   OBESITY Glenda Crosby is here to discuss her progress with her obesity treatment plan along with follow-up of her obesity related diagnoses. Armando is on the Vegetarian Plan + 300 calories and states she is following her eating plan approximately 85-90% of the time. Vernetha states she is exercising for 45-60 minutes 7 times per week.  Today's visit was #: 8 Starting weight: 241 lbs Starting date: 10/27/2022 Today's weight: 178 lbs Today's date: 05/13/2023 Total lbs lost to date: 63 Total lbs lost since last in-office visit: 7  Interim History: Patient has had a good few weeks.  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 Decided on [employer] camp [place].  Wondering about protein one bar and substitution because she is finding it hard to find them. For exercise she is doing a mix of pilate and strength training.  Trying to increase intensity of activity to get more muscle engagement.  No upcoming plans for Memorial Day weekend.   Subjective:   1. Vitamin D deficiency Patient is on prescription vitamin D.  She denies nausea, vomiting, or muscle weakness but notes fatigue.  2. Other hyperlipidemia Patient's last LDL was within normal limits.  She is not on medications.  Assessment/Plan:   1. Vitamin D deficiency We will refill prescription vitamin D for 2 months.  - Vitamin D, Ergocalciferol, (DRISDOL) 1.25 MG (50000 UNIT) CAPS capsule; Take 1 capsule (50,000 Units total) by mouth every 7 (seven) days.  Dispense: 8 capsule; Refill: 0  2. Other hyperlipidemia Patient will have repeat labs with her PCP.  3. BMI 30.0-30.9,adult  4. Obesity with starting BMI of 41.4 Glenda Crosby is currently in the action stage of change. As such, her goal is to continue with weight loss efforts. She has agreed to the Vegetarian Plan + 300 calories.   Exercise goals: All adults should avoid  inactivity. Some physical activity is better than none, and adults who participate in any amount of physical activity gain some health benefits.  Behavioral modification strategies: increasing lean protein intake, meal planning and cooking strategies, keeping healthy foods in the home, and planning for success.  Glenda Crosby has agreed to follow-up with our clinic in 8 weeks. She was informed of the importance of frequent follow-up visits to maximize her success with intensive lifestyle modifications for her multiple health conditions.   Objective:   Blood pressure 117/69, pulse 63, temperature 98.3 F (36.8 C), height 5\' 4"  (1.626 m), weight 178 lb (80.7 kg), last menstrual period 04/26/2023, SpO2 100 %, unknown if currently breastfeeding. Body mass index is 30.55 kg/m.  General: Cooperative, alert, well developed, in no acute distress. HEENT: Conjunctivae and lids unremarkable. Cardiovascular: Regular rhythm.  Lungs: Normal work of breathing. Neurologic: No focal deficits.   Lab Results  Component Value Date   CREATININE 0.75 01/28/2023   BUN 13 01/28/2023   NA 142 01/28/2023   K 4.8 01/28/2023   CL 104 01/28/2023   CO2 23 01/28/2023   Lab Results  Component Value Date   ALT 11 01/28/2023   AST 14 01/28/2023   ALKPHOS 50 01/28/2023   BILITOT 0.3 01/28/2023   Lab Results  Component Value Date   HGBA1C 5.2 01/28/2023   HGBA1C 5.6 10/27/2022   Lab Results  Component Value Date   INSULIN 8.1 01/28/2023   INSULIN 18.6 10/27/2022   No results found for: "  TSH" Lab Results  Component Value Date   CHOL 145 01/28/2023   HDL 45 01/28/2023   LDLCALC 88 01/28/2023   TRIG 59 01/28/2023   Lab Results  Component Value Date   VD25OH 44.6 01/28/2023   VD25OH 25.3 (L) 10/27/2022   Lab Results  Component Value Date   WBC 4.1 01/28/2023   HGB 13.2 01/28/2023   HCT 41.1 01/28/2023   MCV 87 01/28/2023   PLT 216 01/28/2023   Lab Results  Component Value Date   IRON 49  01/28/2023   TIBC 294 01/28/2023   FERRITIN 23 01/28/2023   Attestation Statements:   Reviewed by clinician on day of visit: allergies, medications, problem list, medical history, surgical history, family history, social history, and previous encounter notes.   I, Burt Knack, am acting as transcriptionist for Reuben Likes, MD.  I have reviewed the above documentation for accuracy and completeness, and I agree with the above. - Reuben Likes, MD

## 2023-05-25 ENCOUNTER — Other Ambulatory Visit (HOSPITAL_BASED_OUTPATIENT_CLINIC_OR_DEPARTMENT_OTHER): Payer: Self-pay

## 2023-06-17 ENCOUNTER — Other Ambulatory Visit (HOSPITAL_BASED_OUTPATIENT_CLINIC_OR_DEPARTMENT_OTHER): Payer: Self-pay

## 2023-07-08 ENCOUNTER — Ambulatory Visit (INDEPENDENT_AMBULATORY_CARE_PROVIDER_SITE_OTHER): Payer: Commercial Managed Care - PPO | Admitting: Family Medicine

## 2023-07-08 ENCOUNTER — Encounter (INDEPENDENT_AMBULATORY_CARE_PROVIDER_SITE_OTHER): Payer: Self-pay | Admitting: Family Medicine

## 2023-07-08 VITALS — BP 107/66 | HR 55 | Temp 97.4°F | Ht 64.0 in | Wt 169.0 lb

## 2023-07-08 DIAGNOSIS — E559 Vitamin D deficiency, unspecified: Secondary | ICD-10-CM

## 2023-07-08 DIAGNOSIS — Z6829 Body mass index (BMI) 29.0-29.9, adult: Secondary | ICD-10-CM

## 2023-07-08 DIAGNOSIS — E669 Obesity, unspecified: Secondary | ICD-10-CM | POA: Diagnosis not present

## 2023-07-08 MED ORDER — VITAMIN D3 125 MCG (5000 UT) PO CAPS: 5000.0000 [IU] | ORAL_CAPSULE | Freq: Every day | ORAL | Status: AC

## 2023-07-08 NOTE — Progress Notes (Unsigned)
Chief Complaint:   OBESITY Glenda Crosby is here to discuss her progress with her obesity treatment plan along with follow-up of her obesity related diagnoses. Glenda Crosby is on the Vegetarian Plan + 300 calories and states she is following her eating plan approximately 80% of the time. Glenda Crosby states she is exercising for 45 minutes 6 times per week.  Today's visit was #: 9 Starting weight: 241 lbs Starting date: 10/27/2022 Today's weight: 169 lbs Today's date: 07/08/2023 Total lbs lost to date: 72 Total lbs lost since last in-office visit: 9  Interim History: Patient has been very busy over the summer getting her kids to camp and to various activities. She is ready for her kids to go back to school. No planned trips for next month. She has been sticking to the meal plan but changing time of day she is taking some of the food options in.  She is occasionally skipping meals due to the schedule her kids are in for the summer.   Subjective:   1. Vitamin D deficiency Patient is on prescription vitamin D, and her last vitamin D level was of 44.6.  Assessment/Plan:   1. Vitamin D deficiency Patient is to stop prescription vitamin D, and switch to OTC vitamin D 5000 IU once daily.  2. BMI 29.0-29.9,adult  3. Obesity with starting BMI of 41.4 Glenda Crosby is currently in the action stage of change. As such, her goal is to continue with weight loss efforts. She has agreed to the Vegetarian Plan.   Exercise goals: All adults should avoid inactivity. Some physical activity is better than none, and adults who participate in any amount of physical activity gain some health benefits.  Behavioral modification strategies: increasing lean protein intake, meal planning and cooking strategies, keeping healthy foods in the home, and planning for success.  Glenda Crosby has agreed to follow-up with our clinic in 8 weeks. She was informed of the importance of frequent follow-up visits to maximize her success with intensive  lifestyle modifications for her multiple health conditions.   Objective:   Blood pressure 107/66, pulse (!) 55, temperature (!) 97.4 F (36.3 C), height 5\' 4"  (1.626 m), weight 169 lb (76.7 kg), SpO2 100%, unknown if currently breastfeeding. Body mass index is 29.01 kg/m.  General: Cooperative, alert, well developed, in no acute distress. HEENT: Conjunctivae and lids unremarkable. Cardiovascular: Regular rhythm.  Lungs: Normal work of breathing. Neurologic: No focal deficits.   Lab Results  Component Value Date   CREATININE 0.75 01/28/2023   BUN 13 01/28/2023   NA 142 01/28/2023   K 4.8 01/28/2023   CL 104 01/28/2023   CO2 23 01/28/2023   Lab Results  Component Value Date   ALT 11 01/28/2023   AST 14 01/28/2023   ALKPHOS 50 01/28/2023   BILITOT 0.3 01/28/2023   Lab Results  Component Value Date   HGBA1C 5.2 01/28/2023   HGBA1C 5.6 10/27/2022   Lab Results  Component Value Date   INSULIN 8.1 01/28/2023   INSULIN 18.6 10/27/2022   No results found for: "TSH" Lab Results  Component Value Date   CHOL 145 01/28/2023   HDL 45 01/28/2023   LDLCALC 88 01/28/2023   TRIG 59 01/28/2023   Lab Results  Component Value Date   VD25OH 44.6 01/28/2023   VD25OH 25.3 (L) 10/27/2022   Lab Results  Component Value Date   WBC 4.1 01/28/2023   HGB 13.2 01/28/2023   HCT 41.1 01/28/2023   MCV 87 01/28/2023  PLT 216 01/28/2023   Lab Results  Component Value Date   IRON 49 01/28/2023   TIBC 294 01/28/2023   FERRITIN 23 01/28/2023   Attestation Statements:   Reviewed by clinician on day of visit: allergies, medications, problem list, medical history, surgical history, family history, social history, and previous encounter notes.   I, Burt Knack, am acting as transcriptionist for Reuben Likes, MD.  I have reviewed the above documentation for accuracy and completeness, and I agree with the above. - Reuben Likes, MD

## 2023-09-08 ENCOUNTER — Encounter (INDEPENDENT_AMBULATORY_CARE_PROVIDER_SITE_OTHER): Payer: Self-pay | Admitting: Family Medicine

## 2023-09-08 ENCOUNTER — Ambulatory Visit (INDEPENDENT_AMBULATORY_CARE_PROVIDER_SITE_OTHER): Payer: Commercial Managed Care - PPO | Admitting: Family Medicine

## 2023-09-08 VITALS — BP 116/71 | HR 65 | Temp 97.6°F | Ht 64.0 in | Wt 175.0 lb

## 2023-09-08 DIAGNOSIS — N39498 Other specified urinary incontinence: Secondary | ICD-10-CM

## 2023-09-08 DIAGNOSIS — E559 Vitamin D deficiency, unspecified: Secondary | ICD-10-CM

## 2023-09-08 DIAGNOSIS — Z6830 Body mass index (BMI) 30.0-30.9, adult: Secondary | ICD-10-CM | POA: Diagnosis not present

## 2023-09-08 DIAGNOSIS — E669 Obesity, unspecified: Secondary | ICD-10-CM | POA: Diagnosis not present

## 2023-09-08 NOTE — Progress Notes (Signed)
Chief Complaint:   OBESITY Glenda Crosby is here to discuss her progress with her obesity treatment plan along with follow-up of her obesity related diagnoses. Glenda Crosby is on the Vegetarian Plan and states she is following her eating plan approximately 50% of the time. Glenda Crosby states she is walking and doing strengthening for 20-60 minutes 4-7 times per week.  Today's visit was #: 10 Starting weight: 241 lbs Starting date: 10/27/2022 Today's weight: 175 lbs Today's date: 09/08/2023 Total lbs lost to date: 66 Total lbs lost since last in-office visit: 0  Interim History: Has been a bit harder to follow plan over the last few weeks. She has been alternating between days and nights with work. She has upcoming court possible.  She may be seeing her family from [place] soon. Work has been more demanding recently.  She mentions that she hurt her back and wasn't able to work out as much due to this.   Subjective:   1. Vitamin D deficiency Patient's last labs were in February 2024.  She is on daily OTC vitamin D replacement, and she notes fatigue.  2. Other urinary incontinence Patient has a list of pelvic floor exercises, discussed with her GYN.  Assessment/Plan:   1. Vitamin D deficiency Patient will continue OTC vitamin D with no change in dose.  2. Other urinary incontinence We will follow-up on patient's symptoms at her next appointment.  May need a referral to pelvic floor physical therapy if symptoms persist.  3. BMI 30.0-30.9,adult  4. Obesity with starting BMI of 41.4 Glenda Crosby is currently in the action stage of change. As such, her goal is to continue with weight loss efforts. She has agreed to the Vegetarian Plan.   Exercise goals: All adults should avoid inactivity. Some physical activity is better than none, and adults who participate in any amount of physical activity gain some health benefits.  Behavioral modification strategies: increasing lean protein intake, meal planning and  cooking strategies, keeping healthy foods in the home, and planning for success.  Glenda Crosby has agreed to follow-up with our clinic in 6 weeks. She was informed of the importance of frequent follow-up visits to maximize her success with intensive lifestyle modifications for her multiple health conditions.   Objective:   Blood pressure 116/71, pulse 65, temperature 97.6 F (36.4 C), height 5\' 4"  (1.626 m), weight 175 lb (79.4 kg), SpO2 100%, unknown if currently breastfeeding. Body mass index is 30.04 kg/m.  General: Cooperative, alert, well developed, in no acute distress. HEENT: Conjunctivae and lids unremarkable. Cardiovascular: Regular rhythm.  Lungs: Normal work of breathing. Neurologic: No focal deficits.   Lab Results  Component Value Date   CREATININE 0.75 01/28/2023   BUN 13 01/28/2023   NA 142 01/28/2023   K 4.8 01/28/2023   CL 104 01/28/2023   CO2 23 01/28/2023   Lab Results  Component Value Date   ALT 11 01/28/2023   AST 14 01/28/2023   ALKPHOS 50 01/28/2023   BILITOT 0.3 01/28/2023   Lab Results  Component Value Date   HGBA1C 5.2 01/28/2023   HGBA1C 5.6 10/27/2022   Lab Results  Component Value Date   INSULIN 8.1 01/28/2023   INSULIN 18.6 10/27/2022   No results found for: "TSH" Lab Results  Component Value Date   CHOL 145 01/28/2023   HDL 45 01/28/2023   LDLCALC 88 01/28/2023   TRIG 59 01/28/2023   Lab Results  Component Value Date   VD25OH 44.6 01/28/2023   VD25OH 25.3 (  L) 10/27/2022   Lab Results  Component Value Date   WBC 4.1 01/28/2023   HGB 13.2 01/28/2023   HCT 41.1 01/28/2023   MCV 87 01/28/2023   PLT 216 01/28/2023   Lab Results  Component Value Date   IRON 49 01/28/2023   TIBC 294 01/28/2023   FERRITIN 23 01/28/2023   Attestation Statements:   Reviewed by clinician on day of visit: allergies, medications, problem list, medical history, surgical history, family history, social history, and previous encounter notes.  Time  spent on visit including pre-visit chart review and post-visit care and charting was 30 minutes.   I, Burt Knack, am acting as transcriptionist for Reuben Likes, MD.  I have reviewed the above documentation for accuracy and completeness, and I agree with the above. - Reuben Likes, MD

## 2023-10-20 ENCOUNTER — Ambulatory Visit (INDEPENDENT_AMBULATORY_CARE_PROVIDER_SITE_OTHER): Payer: Commercial Managed Care - PPO | Admitting: Family Medicine
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
## 2024-12-29 ENCOUNTER — Other Ambulatory Visit (HOSPITAL_BASED_OUTPATIENT_CLINIC_OR_DEPARTMENT_OTHER): Payer: Self-pay

## 2024-12-29 MED ORDER — PREDNISONE 10 MG PO TABS
10.0000 mg | ORAL_TABLET | Freq: Three times a day (TID) | ORAL | 0 refills | Status: AC
  Filled 2024-12-29: qty 42, 14d supply, fill #0

## 2025-02-13 ENCOUNTER — Ambulatory Visit: Admitting: Urgent Care
